# Patient Record
Sex: Female | Born: 1990
Health system: Southern US, Community
[De-identification: ages and names within clinical notes are randomized; demographics above are authoritative.]

## PROBLEM LIST (undated history)

## (undated) DIAGNOSIS — R011 Cardiac murmur, unspecified: Secondary | ICD-10-CM

## (undated) DIAGNOSIS — F419 Anxiety disorder, unspecified: Secondary | ICD-10-CM

## (undated) HISTORY — DX: Cardiac murmur, unspecified: R01.1

## (undated) HISTORY — DX: Anxiety disorder, unspecified: F41.9

## (undated) HISTORY — PX: WISDOM TOOTH EXTRACTION: SHX21

---

## 2015-12-12 LAB — HEPATIC FUNCTION PANEL
ALK PHOS: 36 (ref 25–125)
ALT: 10 (ref 7–35)
AST: 15 (ref 13–35)
Bilirubin, Total: 0.4

## 2015-12-12 LAB — LIPID PANEL
Cholesterol: 153 (ref 0–200)
HDL: 94 — AB (ref 35–70)
LDL CALC: 81
Triglycerides: 61 (ref 40–160)

## 2015-12-12 LAB — CBC AND DIFFERENTIAL
HEMATOCRIT: 42 (ref 36–46)
HEMOGLOBIN: 13.7 (ref 12.0–16.0)
PLATELETS: 246 (ref 150–399)
WBC: 6.4

## 2015-12-12 LAB — BASIC METABOLIC PANEL
BUN: 11 (ref 4–21)
Creatinine: 0.8 (ref 0.5–1.1)
Glucose: 87
SODIUM: 138 (ref 137–147)

## 2015-12-12 LAB — VITAMIN D 25 HYDROXY (VIT D DEFICIENCY, FRACTURES): VIT D 25 HYDROXY: 29.4

## 2015-12-12 LAB — TSH: TSH: 2.62 (ref 0.41–5.90)

## 2016-05-27 ENCOUNTER — Other Ambulatory Visit: Payer: Self-pay | Admitting: Family Medicine

## 2016-05-27 DIAGNOSIS — N644 Mastodynia: Secondary | ICD-10-CM

## 2016-06-03 ENCOUNTER — Ambulatory Visit
Admission: RE | Admit: 2016-06-03 | Discharge: 2016-06-03 | Disposition: A | Discharge status: Home / Self Care | Payer: 59 | Source: Ambulatory Visit | Attending: Family Medicine | Admitting: Family Medicine

## 2016-06-03 DIAGNOSIS — N644 Mastodynia: Secondary | ICD-10-CM

## 2017-03-01 ENCOUNTER — Encounter: Payer: Self-pay | Admitting: Family Medicine

## 2017-03-01 ENCOUNTER — Ambulatory Visit (INDEPENDENT_AMBULATORY_CARE_PROVIDER_SITE_OTHER): Payer: 59 | Admitting: Family Medicine

## 2017-03-01 VITALS — HR 156 | Ht 67.0 in | Wt 156.0 lb

## 2017-03-01 DIAGNOSIS — R0781 Pleurodynia: Secondary | ICD-10-CM

## 2017-03-01 DIAGNOSIS — E559 Vitamin D deficiency, unspecified: Secondary | ICD-10-CM

## 2017-03-01 DIAGNOSIS — R000 Tachycardia, unspecified: Secondary | ICD-10-CM | POA: Diagnosis not present

## 2017-03-01 DIAGNOSIS — R011 Cardiac murmur, unspecified: Secondary | ICD-10-CM

## 2017-03-01 DIAGNOSIS — R5383 Other fatigue: Secondary | ICD-10-CM | POA: Diagnosis not present

## 2017-03-01 DIAGNOSIS — R7989 Other specified abnormal findings of blood chemistry: Secondary | ICD-10-CM

## 2017-03-01 DIAGNOSIS — S2329XA Dislocation of other parts of thorax, initial encounter: Secondary | ICD-10-CM | POA: Insufficient documentation

## 2017-03-01 LAB — FERRITIN: Ferritin: 10.8 ng/mL (ref 10.0–291.0)

## 2017-03-01 LAB — BASIC METABOLIC PANEL
BUN: 11 mg/dL (ref 6–23)
CO2: 28 mEq/L (ref 19–32)
Calcium: 9.9 mg/dL (ref 8.4–10.5)
Chloride: 104 mEq/L (ref 96–112)
Creatinine, Ser: 0.69 mg/dL (ref 0.40–1.20)
GFR: 108.2 mL/min (ref >60.00)
Glucose, Bld: 105 mg/dL — ABNORMAL HIGH (ref 70–99)
Potassium: 4.2 mEq/L (ref 3.5–5.1)
Sodium: 138 mEq/L (ref 135–145)

## 2017-03-01 LAB — VITAMIN B12: Vitamin B-12: 413 pg/mL (ref 211–911)

## 2017-03-01 LAB — MAGNESIUM: Magnesium: 1.9 mg/dL (ref 1.5–2.5)

## 2017-03-01 LAB — VITAMIN D 25 HYDROXY (VIT D DEFICIENCY, FRACTURES): VITD: 25.68 ng/mL — ABNORMAL LOW (ref 30.00–100.00)

## 2017-03-01 NOTE — Progress Notes (Signed)
Jennifer Sloan is a 26 y.o. female [facility]   History of Present Illness:   Jennifer Sloan, cma is acting as a Neurosurgeonscribe for W. R. BerkleyErica Ragena Fiola, DO.  HPI:  Heart Murmur. Dx 08/2014. EKG done but unsure if it was normal. Denies chest pain or SHOB. Occasionally she will feel an extra beats and palpations. Sx are typically triggered when she is nervous.  Reports intermittent muscle spasms in her arms, legs and abdominal area. Stress seems to trigger sx. Sx started in 2016. She does not exercise at this time. Sx do not keep her from doing her daily activities but is an annoyance. She was running from her cat x2 months ago when she pulled a muscle in RT leg.   In college she did take Buspar once per day for anxiety. Only took for approximately a year due to medication causing headaches. Medication helped some. She decided to cut out caffeine which did help.  Health Maintenance Due  Topic Date Due  . HIV Screening  10/28/2004  . TETANUS/TDAP  10/28/2008  . PAP SMEAR  10/29/2010   PMHx, SurgHx, SocialHx, Medications, and Allergies were reviewed in the Visit Navigator and updated as appropriate.   Past Medical History:  Diagnosis Date  . Heart murmur    Past Surgical History:  Procedure Laterality Date  . WISDOM TOOTH EXTRACTION Bilateral    Family History  Problem Relation Age of Onset  . Cancer Maternal Grandmother 30       breast  . Cancer Maternal Grandfather        Lung  . Cancer Paternal Grandmother        Colon Cancer  . Cancer Paternal Grandfather        Colon Cancer    Social History  Substance Use Topics  . Smoking status: Never Smoker  . Smokeless tobacco: Never Used  . Alcohol use Yes     Comment: 3-4 beverages per week   Current Medications and Allergies:   .  Ferrous Sulfate (IRON SUPPLEMENT PO), Take 28 mg by mouth once., Disp: , Rfl:   Allergies  Allergen Reactions  . Amoxicillin Rash   Review of Systems:   Pertinent items are noted in the  HPI. Otherwise, ROS is negative.  Vitals:   Vitals:   03/01/17 1054  Pulse: (!) 156  SpO2: 99%  Weight: 156 lb (70.8 kg)  Height: 5\' 7"  (1.702 m)     Body mass index is 24.43 kg/m.  Physical Exam:   Physical Exam  Constitutional: She appears well-developed and well-nourished. No distress.  HENT:  Head: Normocephalic and atraumatic.  Right Ear: External ear normal.  Left Ear: External ear normal.  Nose: Nose normal.  Mouth/Throat: Oropharynx is clear and moist.  Eyes: Pupils are equal, round, and reactive to light. Conjunctivae and EOM are normal.  Neck: Normal range of motion. Neck supple. No thyromegaly present.  Cardiovascular: Regular rhythm, S1 normal, S2 normal, normal heart sounds and intact distal pulses.  Tachycardia present.   No murmur heard. Pulmonary/Chest: Effort normal and breath sounds normal.  Abdominal: Soft. Bowel sounds are normal.  Musculoskeletal:       Arms: Skin: Skin is warm.  Psychiatric: She has a normal mood and affect. Her behavior is normal.  Nursing note and vitals reviewed.  Assessment and Plan:   Marchelle Folksmanda was seen today for new patient (initial visit).  Diagnoses and all orders for this visit:  Rib pain Comments: Spasm associated with poor posture  and leaning on her left side while sitting at work.  Orders: -     Ambulatory referral to Sports Medicine  Heart murmur Comments: No concerns tody.  Low vitamin D level -     VITAMIN D 25 Hydroxy (Vit-D Deficiency, Fractures)  Tachycardia Comments: Improved to 105 during exam. Patient reports resting HR of 70.  Orders: -     Ferritin -     Vitamin B12 -     Basic metabolic panel -     Magnesium  . Reviewed expectations re: course of current medical issues. . Discussed self-management of symptoms. . Outlined signs and symptoms indicating need for more acute intervention. . Patient verbalized understanding and all questions were answered. Marland Kitchen. Health Maintenance issues including  appropriate healthy diet, exercise, and smoking avoidance were discussed with patient. . See orders for this visit as documented in the electronic medical record. . Patient received an After Visit Summary.  CMA served as Neurosurgeonscribe during this visit. History, Physical, and Plan performed by medical provider. The above documentation has been reviewed and is accurate and complete. Helane RimaErica Loveda Colaizzi, D.O.  Helane RimaErica Rendi Mapel, DO , [place] 03/01/2017  Future Appointments Date Time Provider Department Center  03/10/2017 10:30 AM Andrena Mewsigby, Michael D, DO [facility] None

## 2017-03-09 ENCOUNTER — Telehealth: Payer: Self-pay | Admitting: Family Medicine

## 2017-03-09 NOTE — Telephone Encounter (Signed)
[facility] [place]

## 2017-03-10 ENCOUNTER — Ambulatory Visit (INDEPENDENT_AMBULATORY_CARE_PROVIDER_SITE_OTHER): Payer: 59 | Admitting: Sports Medicine

## 2017-03-10 ENCOUNTER — Ambulatory Visit: Payer: 59 | Admitting: Family Medicine

## 2017-03-10 ENCOUNTER — Encounter: Payer: Self-pay | Admitting: Sports Medicine

## 2017-03-10 VITALS — BP 110/60 | HR 93 | Ht 67.0 in | Wt 116.0 lb

## 2017-03-10 DIAGNOSIS — R0781 Pleurodynia: Secondary | ICD-10-CM

## 2017-03-10 DIAGNOSIS — M9908 Segmental and somatic dysfunction of rib cage: Secondary | ICD-10-CM | POA: Diagnosis not present

## 2017-03-10 NOTE — Patient Instructions (Signed)
Also check out the YouTube Video from Dr. Eric Goodman.  I would like to see you try performing this 5-6 days per week.    A good intro video is: "Independence from Pain 7-minute Video" - https://www.youtube.com/watch?v=[email or web address]   His more advanced video is: "Powerful Posture and Pain Relief: 12 minutes of Foundation Training" - https://youtu.be/[email or web address]   Do not try to attempt this entire video when first beginning.    Try breaking of each exercise that he goes into shorter segments.  Otherwise if they perform an exercise for 45 seconds, start with 15 seconds and rest and then resume with a begin the new activity.  Work your way up to doing this 12 minute video and I expect to see significant improvements in your pain.  

## 2017-03-10 NOTE — Progress Notes (Signed)
OFFICE VISIT NOTE Jennifer Sloan. Jennifer Sloan Sports Medicine [facility] at [facility] [phone]  Jennifer Sloan - 26 y.o. female MRN [id number]  Date of birth: [birth year]  Visit Date: 03/10/2017  PCP: Helane Rima, DO   Referred by: Helane Rima, DO  Autumn McNeil, cma acting as scribe for Dr. Berline Chough.  SUBJECTIVE:   Chief Complaint  Patient presents with  . NP: Rib Pain   HPI: As below and per problem based documentation when appropriate.   Jennifer Sloan reports LUQ pain since January 2016 after exercising. Pain is intermittent. Trigger at rest and with movement. When twisting her upper body there is a pulling sensation. When sitting at her desk slouching the pain is noticeable. Describes pain as dull. At times here is slight radiation upper toward left side. Takes Ibuprofen with severe discomfort.    Review of Systems  Constitutional: Negative for chills, diaphoresis, fever, malaise/fatigue and weight loss.  HENT: Negative.   Eyes: Negative.   Respiratory: Negative.   Cardiovascular: Negative.   Gastrointestinal: Negative.   Genitourinary: Negative.   Musculoskeletal: Positive for joint pain. Negative for back pain, falls, myalgias and neck pain.  Skin: Negative for itching and rash.  Neurological: Negative.  Negative for weakness.  Endo/Heme/Allergies: Negative for environmental allergies and polydipsia. Does not bruise/bleed easily.  Psychiatric/Behavioral: Negative.     Otherwise per HPI.  HISTORY & PERTINENT PRIOR DATA:  No specialty comments available. She reports that she has never smoked. She has never used smokeless tobacco. No results for input(s): HGBA1C, LABURIC in the last 8760 hours. Medications & Allergies reviewed per EMR Patient Active Problem List   Diagnosis Date Noted  . Heart murmur 03/01/2017  . Costochondral separation 03/01/2017   Past Medical History:  Diagnosis Date  . Heart murmur    Family History  Problem Relation Age of  Onset  . Cancer Maternal Grandmother 30       breast  . Cancer Maternal Grandfather        Lung  . Cancer Paternal Grandmother        Colon Cancer  . Cancer Paternal Grandfather        Colon Cancer   Past Surgical History:  Procedure Laterality Date  . WISDOM TOOTH EXTRACTION Bilateral    Social History   Occupational History  . Not on file.   Social History Main Topics  . Smoking status: Never Smoker  . Smokeless tobacco: Never Used  . Alcohol use Yes     Comment: 3-4 beverages per week  . Drug use: No  . Sexual activity: No    OBJECTIVE:  VS:  HT:5\' 7"  (170.2 cm)   WT:116 lb (52.6 kg)  BMI:18.2    BP:110/60  HR:93bpm  TEMP: ( )  RESP:99 % EXAM: Findings:  WDWN, NAD, Non-toxic appearing Alert & appropriately interactive Not depressed or anxious appearing No increased work of breathing. Pupils are equal. EOM intact without nystagmus No clubbing or cyanosis of the extremities appreciated No significant rashes/lesions/ulcerations overlying the examined area. DP & PT pulses 2+/4.  No significant pretibial edema.  No clubbing or cyanosis Sensation intact to light touch in lower extremities.  Back: Overall fairly normal alignment with slight rib prominence on the  right.  She does have a slight protrusion of the left anterior ribs but this is anatomic.  She is slightly tender in this area.  She has poor thoracic mobility to the left greater than right.  No focal bony tenderness. OSTEOPATHIC/STRUCTURAL  EXAM:   T2 through T6 neutral rotated right, side bent left Ribs 7,8 and 9 are posterior on the left and at the costochondral junction on the left     No results found. ASSESSMENT & PLAN:     ICD-10-CM   1. Rib pain R07.81   2. Somatic dysfunction of rib cage region M99.08   ================================================================= Costochondral separation She has some underlying anatomic prominence that was worsened with the incident that occurred last  year.  There is no significant pathologic change however she does have a functional rotational issue of the thoracic spine with associated rib changes.  Osteopathic medication performed today as below and therapeutic exercises working on thoracic rotation and posterior chain conditioning were reviewed.  ++++++++++++++++++++++++++++++++++++++++++++ PROCEDURE NOTE : OSTEOPATHIC MANIPULATION The decision today to treat with Osteopathic Manipulative Therapy (OMT) was based on physical exam findings. Verbal consent was obtained after after explanation of risks, benefits and potential side effects, including acute pain flare, post manipulation soreness and need for repeat treatments.  If Cervical manipulation was performed additional time was spent discussing the associated minimal risk of  injury to neurovascular structures.  After consent was obtained manipulation was performed as below:            Regions treated:  Per billing codes          Techniques used:  Muscle Energy, HVLA  The patient tolerated the treatment well and reported Improved symptoms following treatment today. Patient was given medications, exercises, stretches and lifestyle modifications per AVS and verbally.    ================================================================= Patient Instructions  Also check out the YouTube Video from Dr. Myles LippsEric Goodman.  I would like to see you try performing this 5-6 days per week.    A good intro video is: "Independence from Pain 7-minute Video" - https://[email or web address]://www.[email or web address]/watch?[email or web address]   His more advanced video is: "Powerful Posture and Pain Relief: 12 minutes of Foundation Training" - https://youtu.be/[email or web address]   Do not try to attempt this entire video when first beginning.    Try breaking of each exercise that he goes into shorter segments.  Otherwise if they perform an exercise for 45 seconds, start with 15 seconds and rest and then resume with a begin the new activity.  Work your way up  to doing this 12 minute video and I expect to see significant improvements in your pain. =================================================================   Follow-up: Return in about 6 weeks (around 04/21/2017).   CMA/ATC served as Neurosurgeonscribe during this visit. History, Physical, and Plan performed by medical provider. Documentation and orders reviewed and attested to.      Gaspar BiddingMichael Rigby, DO    [facility]

## 2017-03-15 NOTE — Telephone Encounter (Signed)
[facility] [place] forward 8 pages to Coca ColaWallace Eric DO

## 2017-03-28 NOTE — Assessment & Plan Note (Signed)
She has some underlying anatomic prominence that was worsened with the incident that occurred last year.  There is no significant pathologic change however she does have a functional rotational issue of the thoracic spine with associated rib changes.  Osteopathic medication performed today as below and therapeutic exercises working on thoracic rotation and posterior chain conditioning were reviewed.  ++++++++++++++++++++++++++++++++++++++++++++ PROCEDURE NOTE : OSTEOPATHIC MANIPULATION The decision today to treat with Osteopathic Manipulative Therapy (OMT) was based on physical exam findings. Verbal consent was obtained after after explanation of risks, benefits and potential side effects, including acute pain flare, post manipulation soreness and need for repeat treatments.  If Cervical manipulation was performed additional time was spent discussing the associated minimal risk of  injury to neurovascular structures.  After consent was obtained manipulation was performed as below:            Regions treated:  Per billing codes          Techniques used:  Muscle Energy, HVLA  The patient tolerated the treatment well and reported Improved symptoms following treatment today. Patient was given medications, exercises, stretches and lifestyle modifications per AVS and verbally.

## 2017-03-30 ENCOUNTER — Encounter: Payer: Self-pay | Admitting: Family Medicine

## 2017-09-29 ENCOUNTER — Encounter: Payer: Self-pay | Admitting: Sports Medicine

## 2017-09-29 ENCOUNTER — Ambulatory Visit: Payer: 59 | Admitting: Sports Medicine

## 2017-09-29 VITALS — BP 118/68 | HR 113 | Ht 67.0 in | Wt 119.4 lb

## 2017-09-29 DIAGNOSIS — M9908 Segmental and somatic dysfunction of rib cage: Secondary | ICD-10-CM | POA: Diagnosis not present

## 2017-09-29 DIAGNOSIS — M9901 Segmental and somatic dysfunction of cervical region: Secondary | ICD-10-CM | POA: Diagnosis not present

## 2017-09-29 DIAGNOSIS — M25512 Pain in left shoulder: Secondary | ICD-10-CM | POA: Diagnosis not present

## 2017-09-29 DIAGNOSIS — M9902 Segmental and somatic dysfunction of thoracic region: Secondary | ICD-10-CM

## 2017-09-29 DIAGNOSIS — R0781 Pleurodynia: Secondary | ICD-10-CM

## 2017-09-29 DIAGNOSIS — M9903 Segmental and somatic dysfunction of lumbar region: Secondary | ICD-10-CM

## 2017-09-29 DIAGNOSIS — M9904 Segmental and somatic dysfunction of sacral region: Secondary | ICD-10-CM

## 2017-09-29 NOTE — Progress Notes (Signed)
Jennifer Sloan. Jennifer Sloan Sports Medicine [facility] at [facility] [phone]  Jennifer Sloan - 27 y.o. female MRN [id number]  Date of birth: 1991/04/18  Visit Date: 09/29/2017  PCP: Helane Rima, DO   Referred by: Helane Rima, DO   Scribe for today's visit: Christoper Fabian, LAT, ATC     SUBJECTIVE:  Jennifer Sloan is here for Follow-up (L shoulder and back pain) .   Info from OV on 03/10/17: [name] reports LUQ pain since January 2016 after exercising. Pain is intermittent. Trigger at rest and with movement. When twisting her upper body there is a pulling sensation. When sitting at her desk slouching the pain is noticeable. Describes pain as dull. At times here is slight radiation upper toward left side. Takes Ibuprofen with severe discomfort.   Compared to the last office visit on 03/10/17, her previously described L shoulder, rib and back pain symptoms are worsening w/ increased pain and muscle tension w/ main location of symptoms being in the L neck, upper trap and L sholder. Current symptoms are mild & are radiating to L UE. She has been taking IBU prn.  She states that she has not been doing the [place] exercises on a regular basis.   ROS Reports night time disturbances. Denies fevers, chills, or night sweats. Denies unexplained weight loss. Denies personal history of cancer. Denies changes in bowel or bladder habits. Denies recent unreported falls. Denies new or worsening dyspnea or wheezing. Denies headaches or dizziness.  Denies numbness, tingling or weakness  In the extremities.  Denies dizziness or presyncopal episodes Denies lower extremity edema     HISTORY & PERTINENT PRIOR DATA:  Prior History reviewed and updated per electronic medical record.  Significant history, findings, studies and interim changes include:  reports that  has never smoked. she has never used smokeless tobacco. No results for input(s): HGBA1C, LABURIC, CREATINE in the last  8760 hours. No specialty comments available. No problems updated.  OBJECTIVE:  VS:  HT:5\' 7"  (170.2 cm)   WT:119 lb 6.4 oz (54.2 kg)  BMI:18.7    BP:118/68  HR:(!) 113bpm  TEMP: ( )  RESP:99 %   PHYSICAL EXAM: Constitutional: WDWN, Non-toxic appearing. Psychiatric: Alert & appropriately interactive.  Not depressed or anxious appearing. Respiratory: No increased work of breathing.  Trachea Midline Eyes: Pupils are equal.  EOM intact without nystagmus.  No scleral icterus  NEUROVASCULAR exam: No clubbing or cyanosis appreciated No significant venous stasis changes Capillary Refill: normal, less than 2 seconds   Full overhead range of motion bilateral shoulders.  Generalized length is 4 out of 5 with internal rotation, external rotation, empty can testing and speeds testing bilaterally.  She has protracted shoulders bilaterally left worse than right.  Otherwise structural exam per procedure note   ASSESSMENT & PLAN:   1. Pain in joint of left shoulder   2. Rib pain   3. Somatic dysfunction of cervical region   4. Somatic dysfunction of thoracic region   5. Somatic dysfunction of rib cage region   6. Somatic dysfunction of lumbar region   7. Somatic dysfunction of sacral region    ++++++++++++++++++++++++++++++++++++++++++++ Orders & Meds: No orders of the defined types were placed in this encounter.  No orders of the defined types were placed in this encounter.  ++++++++++++++++++++++++++++++++++++++++++++ PLAN:   Findings:  She has generalized hypermobility with poor stability of bilateral shoulders with a anterior chain dominance.  She will benefit from continued therapeutic exercises and home  exercise program.  Formal referral to physical therapy was discussed and she would like to hold off on this at this time given the symptoms tend to only be intermittent and are functional in nature.  We will plan to check in with her in 4 weeks and see how she is doing at that time  and plan to reevaluate for potential repeat osteopathic manipulation   No problem-specific Assessment & Plan notes found for this encounter.   Follow-up: Return in about 4 weeks (around 10/27/2017).   Pertinent documentation may be included in additional procedure notes, imaging studies, problem based documentation and patient instructions. Please see these sections of the encounter for additional information regarding this visit. CMA/ATC served as Neurosurgeonscribe during this visit. History, Physical, and Plan performed by medical provider. Documentation and orders reviewed and attested to.      Andrena MewsMichael D Rigby, DO    [facility] Physician

## 2017-09-29 NOTE — Procedures (Signed)
PROCEDURE NOTE : OSTEOPATHIC MANIPULATION The decision today to treat with Osteopathic Manipulative Therapy (OMT) was based on physical exam findings. Verbal consent was obtained following a discussion with the patient regarding the of risks, benefits and potential side effects, including an acute pain flare,post manipulation soreness and need for repeat treatments.     Contraindications: NONE  Manipulation was performed as below: Regions Treated: lumbar spine Techniques used: HVLA, muscle energy, myofascial release, soft tissue, counterstrain and Articulatory The patient tolerated the treatment well and reported Improved symptoms following treatment today. Patient was given medications, exercises, stretches and lifestyle modifications per AVS and verbally.     OSTEOPATHIC/STRUCTURAL EXAM FINDINGS:    C2 through C4 FRS left  C5 FRS right  T2 extended side bent right  T4 through T6 rotated left  L3 FRS right  Left on left sacral torsion  Right anterior innominate  Tight right hamstring compared to the left but 170 degree popliteal angle

## 2017-09-29 NOTE — Patient Instructions (Signed)

## 2017-10-27 ENCOUNTER — Ambulatory Visit: Payer: 59 | Admitting: Sports Medicine

## 2017-12-06 ENCOUNTER — Telehealth: Payer: Self-pay | Admitting: Family Medicine

## 2017-12-06 NOTE — Telephone Encounter (Signed)
Copied from CRM [id number]. Topic: General - Other >> Dec 06, 2017  1:59 PM Eston Mould B wrote: Reason for CRM:  PT schedule cpe for 8/2 and will come in fasting for her labs,  she needs lab orders

## 2017-12-07 NOTE — Telephone Encounter (Signed)
Labs will be ordered the day of visit.

## 2017-12-23 DIAGNOSIS — M542 Cervicalgia: Secondary | ICD-10-CM | POA: Diagnosis not present

## 2017-12-23 DIAGNOSIS — M546 Pain in thoracic spine: Secondary | ICD-10-CM | POA: Diagnosis not present

## 2017-12-23 DIAGNOSIS — M545 Low back pain: Secondary | ICD-10-CM | POA: Diagnosis not present

## 2017-12-28 DIAGNOSIS — M545 Low back pain: Secondary | ICD-10-CM | POA: Diagnosis not present

## 2017-12-28 DIAGNOSIS — M542 Cervicalgia: Secondary | ICD-10-CM | POA: Diagnosis not present

## 2017-12-28 DIAGNOSIS — M546 Pain in thoracic spine: Secondary | ICD-10-CM | POA: Diagnosis not present

## 2017-12-30 DIAGNOSIS — M546 Pain in thoracic spine: Secondary | ICD-10-CM | POA: Diagnosis not present

## 2017-12-30 DIAGNOSIS — M542 Cervicalgia: Secondary | ICD-10-CM | POA: Diagnosis not present

## 2017-12-30 DIAGNOSIS — M545 Low back pain: Secondary | ICD-10-CM | POA: Diagnosis not present

## 2018-01-04 DIAGNOSIS — M545 Low back pain: Secondary | ICD-10-CM | POA: Diagnosis not present

## 2018-01-04 DIAGNOSIS — M542 Cervicalgia: Secondary | ICD-10-CM | POA: Diagnosis not present

## 2018-01-04 DIAGNOSIS — M546 Pain in thoracic spine: Secondary | ICD-10-CM | POA: Diagnosis not present

## 2018-03-04 ENCOUNTER — Encounter: Payer: 59 | Admitting: Family Medicine

## 2018-03-18 ENCOUNTER — Encounter: Payer: Self-pay | Admitting: Family Medicine

## 2018-03-18 ENCOUNTER — Ambulatory Visit (INDEPENDENT_AMBULATORY_CARE_PROVIDER_SITE_OTHER): Payer: 59 | Admitting: Family Medicine

## 2018-03-18 VITALS — BP 132/72 | HR 113 | Temp 98.4°F | Ht 67.0 in | Wt 116.2 lb

## 2018-03-18 DIAGNOSIS — E559 Vitamin D deficiency, unspecified: Secondary | ICD-10-CM

## 2018-03-18 DIAGNOSIS — R5383 Other fatigue: Secondary | ICD-10-CM | POA: Diagnosis not present

## 2018-03-18 DIAGNOSIS — Z0000 Encounter for general adult medical examination without abnormal findings: Secondary | ICD-10-CM | POA: Diagnosis not present

## 2018-03-18 DIAGNOSIS — Z13220 Encounter for screening for lipoid disorders: Secondary | ICD-10-CM | POA: Diagnosis not present

## 2018-03-18 DIAGNOSIS — R002 Palpitations: Secondary | ICD-10-CM

## 2018-03-18 LAB — CBC WITH DIFFERENTIAL/PLATELET
Basophils Absolute: 0 10*3/uL (ref 0.0–0.1)
Basophils Relative: 0.7 % (ref 0.0–3.0)
Eosinophils Absolute: 0.2 10*3/uL (ref 0.0–0.7)
Eosinophils Relative: 2.6 % (ref 0.0–5.0)
HCT: 42 % (ref 36.0–46.0)
Hemoglobin: 14.2 g/dL (ref 12.0–15.0)
Lymphocytes Relative: 30.5 % (ref 12.0–46.0)
Lymphs Abs: 2 10*3/uL (ref 0.7–4.0)
MCHC: 33.8 g/dL (ref 30.0–36.0)
MCV: 86.8 fl (ref 78.0–100.0)
Monocytes Absolute: 0.5 10*3/uL (ref 0.1–1.0)
Monocytes Relative: 7.6 % (ref 3.0–12.0)
Neutro Abs: 3.9 10*3/uL (ref 1.4–7.7)
Neutrophils Relative %: 58.6 % (ref 43.0–77.0)
Platelets: 242 10*3/uL (ref 150.0–400.0)
RBC: 4.84 Mil/uL (ref 3.87–5.11)
RDW: 13.3 % (ref 11.5–15.5)
WBC: 6.6 10*3/uL (ref 4.0–10.5)

## 2018-03-18 LAB — COMPREHENSIVE METABOLIC PANEL
ALT: 9 U/L (ref 0–35)
AST: 14 U/L (ref 0–37)
Albumin: 4.6 g/dL (ref 3.5–5.2)
Alkaline Phosphatase: 34 U/L — ABNORMAL LOW (ref 39–117)
BUN: 13 mg/dL (ref 6–23)
CO2: 30 mEq/L (ref 19–32)
Calcium: 9.9 mg/dL (ref 8.4–10.5)
Chloride: 103 mEq/L (ref 96–112)
Creatinine, Ser: 0.8 mg/dL (ref 0.40–1.20)
GFR: 90.53 mL/min (ref >60.00)
Glucose, Bld: 96 mg/dL (ref 70–99)
Potassium: 3.6 mEq/L (ref 3.5–5.1)
Sodium: 139 mEq/L (ref 135–145)
Total Bilirubin: 0.4 mg/dL (ref 0.2–1.2)
Total Protein: 7.6 g/dL (ref 6.0–8.3)

## 2018-03-18 LAB — LIPID PANEL
Cholesterol: 131 mg/dL (ref 0–200)
HDL: 57.3 mg/dL (ref >39.00)
LDL Cholesterol: 63 mg/dL (ref 0–99)
NonHDL: 73.33
Total CHOL/HDL Ratio: 2
Triglycerides: 51 mg/dL (ref 0.0–149.0)
VLDL: 10.2 mg/dL (ref 0.0–40.0)

## 2018-03-18 LAB — T4, FREE: Free T4: 0.96 ng/dL (ref 0.60–1.60)

## 2018-03-18 LAB — MAGNESIUM: Magnesium: 2 mg/dL (ref 1.5–2.5)

## 2018-03-18 LAB — TSH: TSH: 3.68 u[IU]/mL (ref 0.35–4.50)

## 2018-03-18 LAB — VITAMIN D 25 HYDROXY (VIT D DEFICIENCY, FRACTURES): VITD: 34.91 ng/mL (ref 30.00–100.00)

## 2018-03-18 NOTE — Progress Notes (Signed)
Subjective:    Lytle Michaelsmanda Holts is a 27 y.o. female and is here for a comprehensive physical exam.  Current Outpatient Medications:  .  cholecalciferol (VITAMIN D) 1000 units tablet, Take 2,000 Units by mouth daily., Disp: , Rfl:  .  Ferrous Sulfate (IRON SUPPLEMENT PO), Take 28 mg by mouth once., Disp: , Rfl:  .  Magnesium 250 MG TABS, Take 250 mg by mouth., Disp: , Rfl:   Health Maintenance Due  Topic Date Due  . INFLUENZA VACCINE  03/03/2018    PMHx, SurgHx, SocialHx, Medications, and Allergies were reviewed in the Visit Navigator and updated as appropriate.   Past Medical History:  Diagnosis Date  . Heart murmur      Past Surgical History:  Procedure Laterality Date  . WISDOM TOOTH EXTRACTION Bilateral      Family History  Problem Relation Age of Onset  . Breast cancer Maternal Grandmother 30  . Lung cancer Maternal Grandfather   . Colon cancer Paternal Grandmother   . Colon cancer Paternal Grandfather     Social History   Tobacco Use  . Smoking status: Never Smoker  . Smokeless tobacco: Never Used  Substance Use Topics  . Alcohol use: Yes    Comment: 3-4 beverages per week  . Drug use: No    Review of Systems:   Pertinent items are noted in the HPI. Otherwise, ROS is negative.  Objective:   BP 132/72   Pulse (!) 113   Temp 98.4 F (36.9 C) (Oral)   Ht 5\' 7"  (1.702 m)   Wt 116 lb 3.2 oz (52.7 kg)   LMP 03/03/2018   SpO2 99%   BMI 18.20 kg/m    General appearance: alert, cooperative and appears stated age. Head: normocephalic, without obvious abnormality, atraumatic. Neck: no adenopathy, supple, symmetrical, trachea midline; thyroid not enlarged, symmetric, no tenderness/mass/nodules. Lungs: clear to auscultation bilaterally. Heart: regular rate and rhythm Abdomen: soft, non-tender; no masses,  no organomegaly. Extremities: extremities normal, atraumatic, no cyanosis or edema. Skin: skin color, texture, turgor normal, no rashes or  lesions. Lymph: cervical, supraclavicular, and axillary nodes normal; no abnormal inguinal nodes palpated. Neurologic: grossly normal.  EKG: normal sinus rhythm.  Assessment/Plan:   Jennifer Sloan was seen today for annual exam.  Diagnoses and all orders for this visit:  Routine physical examination  Screening for lipid disorders -     Lipid panel  Fatigue, unspecified type -     CBC with Differential/Platelet -     Comprehensive metabolic panel  Vitamin D deficiency -     VITAMIN D 25 Hydroxy (Vit-D Deficiency, Fractures)  Palpitations -     EKG 12-Lead -     ECHOCARDIOGRAM COMPLETE; Future -     TSH -     T4, free -     Magnesium    Patient Counseling:   [x]     Nutrition: Stressed importance of moderation in sodium/caffeine intake, saturated fat and cholesterol, caloric balance, sufficient intake of fresh fruits, vegetables, fiber, calcium, iron, and 1 mg of folate supplement per day (for females capable of pregnancy).   [x]      Stressed the importance of regular exercise.    [x]     Substance Abuse: Discussed cessation/primary prevention of tobacco, alcohol, or other drug use; driving or other dangerous activities under the influence; availability of treatment for abuse.    [x]      Injury prevention: Discussed safety belts, safety helmets, smoke detector, smoking near bedding or upholstery.    [  x]     Sexuality: Discussed sexually transmitted diseases, partner selection, use of condoms, avoidance of unintended pregnancy  and contraceptive alternatives.    [x]     Dental health: Discussed importance of regular tooth brushing, flossing, and dental visits.   [x]      Health maintenance and immunizations reviewed. Please refer to Health maintenance section.   Briscoe Deutscher, DO [place]

## 2018-04-05 ENCOUNTER — Other Ambulatory Visit: Payer: Self-pay

## 2018-04-05 ENCOUNTER — Ambulatory Visit (HOSPITAL_COMMUNITY): Payer: 59 | Attending: Family Medicine

## 2018-04-05 DIAGNOSIS — R002 Palpitations: Secondary | ICD-10-CM | POA: Diagnosis not present

## 2018-05-19 ENCOUNTER — Ambulatory Visit: Payer: 59 | Admitting: Sports Medicine

## 2018-05-19 ENCOUNTER — Encounter: Payer: Self-pay | Admitting: Sports Medicine

## 2018-05-19 ENCOUNTER — Other Ambulatory Visit: Payer: Self-pay

## 2018-05-19 VITALS — BP 120/72 | HR 120 | Ht 67.0 in | Wt 116.0 lb

## 2018-05-19 DIAGNOSIS — M9901 Segmental and somatic dysfunction of cervical region: Secondary | ICD-10-CM | POA: Diagnosis not present

## 2018-05-19 DIAGNOSIS — I059 Rheumatic mitral valve disease, unspecified: Secondary | ICD-10-CM

## 2018-05-19 DIAGNOSIS — M9902 Segmental and somatic dysfunction of thoracic region: Secondary | ICD-10-CM

## 2018-05-19 DIAGNOSIS — M25512 Pain in left shoulder: Secondary | ICD-10-CM | POA: Diagnosis not present

## 2018-05-19 DIAGNOSIS — R002 Palpitations: Secondary | ICD-10-CM

## 2018-05-19 DIAGNOSIS — M9903 Segmental and somatic dysfunction of lumbar region: Secondary | ICD-10-CM

## 2018-05-19 DIAGNOSIS — M9908 Segmental and somatic dysfunction of rib cage: Secondary | ICD-10-CM

## 2018-05-19 NOTE — Progress Notes (Signed)
PROCEDURE NOTE : OSTEOPATHIC MANIPULATION The decision today to treat with Osteopathic Manipulative Therapy (OMT) was based on physical exam findings. Verbal consent was obtained following a discussion with the patient regarding the of risks, benefits and potential side effects, including an acute pain flare,post manipulation soreness and need for repeat treatments.     Contraindications to OMT: NONE  Manipulation was performed as below: Regions Treated OMT Techniques Used  Cervical spine Thoracic spine Ribs Lumbar spine HVLA muscle energy myofascial release     The patient tolerated the treatment well and reported Improved symptoms following treatment today. Patient was given medications, exercises, stretches and lifestyle modifications per AVS and verbally.   OSTEOPATHIC/STRUCTURAL EXAM:   OA - rotated right C2 FRS left (Flexed, Rotated & Sidebent) C7 FRS right (Flexed, Rotated & Sidebent) T2 -8 Neutral, Rotated LEFT, Sidebent RIGHT Rib 6 Right  Posterior L1 FRS right (Flexed, Rotated & Sidebent) Anterior [place] jiont

## 2018-05-19 NOTE — Progress Notes (Signed)
Cardiology Office Note   Date:  05/20/2018   ID:  Jennifer Sloan, DOB 1991-05-14, MRN [id number]  PCP:  Helane Rima, DO  Cardiologist:   No primary care provider on file. Referring:      Chief Complaint  Patient presents with  . Abnormal Echo      History of Present Illness: Jennifer Sloan is a 27 y.o. female who is referred by Helane Rima, DO for evaluation of an abnormal echo.  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  She had a NL LVEF.  She is had no cardiac history.  She is been healthy although stressed with a very active. The patient denies any new symptoms such as chest discomfort, neck or arm discomfort. There has been no new shortness of breath, PND or orthopnea. There have been no reported palpitations, presyncope or syncope.  She is always had a rapid heart rate.  She says she gets nervous and she does have anxiety but she working on.  She is always been thin.  She is not any weight loss.  She does not feel her palpitations or rapid heart rate except for occasional PVCs which she is had for years.  She is been told about these.  She will feel skipped beat occasionally when she becomes anxious.  She does not have any presyncope or syncope.  She has no chest pressure, neck or arm discomfort.  She denies any shortness of breath, PND or orthopnea.   Past Medical History:  Diagnosis Date  . Anxiety   . Heart murmur     Past Surgical History:  Procedure Laterality Date  . WISDOM TOOTH EXTRACTION Bilateral      Current Outpatient Medications  Medication Sig Dispense Refill  . ASHWAGANDHA PO Take 450 mg by mouth.     . cholecalciferol (VITAMIN D) 1000 units tablet Take 2,000 Units by mouth daily.    . Ferrous Sulfate (IRON SUPPLEMENT PO) Take 28 mg by mouth once.    . Magnesium 250 MG TABS Take 250 mg by mouth.    . Omega-3 Fatty Acids (FISH OIL) 500 MG CAPS Take by mouth.     No current facility-administered medications for this  visit.     Allergies:   Amoxicillin    Social History:  The patient  reports that she has never smoked. She has never used smokeless tobacco. She reports that she drinks alcohol. She reports that she does not use drugs.   Family History:  The patient's family history includes Breast cancer (age of onset: 22) in her maternal grandmother; Colon cancer in her paternal grandfather and paternal grandmother; Lung cancer in her maternal grandfather; Mitral valve prolapse in her mother.    ROS:  Please see the history of present illness.   Otherwise, review of systems are positive for none.   All other systems are reviewed and negative.    PHYSICAL EXAM: VS:  BP 138/89   Pulse (!) 128   Ht 5\' 7"  (1.702 m)   Wt 118 lb (53.5 kg)   LMP 05/03/2018 (Approximate)   SpO2 96%   BMI 18.48 kg/m  , BMI Body mass index is 18.48 kg/m. GENERAL:  Well appearing HEENT:  Pupils equal round and reactive, fundi not visualized, oral mucosa unremarkable NECK:  No jugular venous distention, waveform within normal limits, carotid upstroke brisk and symmetric, no bruits, no thyromegaly LYMPHATICS:  No cervical, inguinal adenopathy LUNGS:  Clear to auscultation bilaterally BACK:  No CVA tenderness  CHEST:  Unremarkable HEART:  PMI not displaced or sustained,S1 and S2 within normal limits, no S3, no S4, no clicks, no rubs, no murmurs ABD:  Flat, positive bowel sounds normal in frequency in pitch, no bruits, no rebound, no guarding, no midline pulsatile mass, no hepatomegaly, no splenomegaly EXT:  2 plus pulses throughout, no edema, no cyanosis no clubbing SKIN:  No rashes no nodules NEURO:  Cranial nerves II through XII grossly intact, motor grossly intact throughout PSYCH:  Cognitively intact, oriented to person place and time    EKG:  EKG is not ordered today. The ekg ordered 03/18/18 demonstrates sinus rhythm, rate 90, axis within normal limits, intervals within normal limits, no acute ST-T wave changes,  premature ventricular contractions.   Recent Labs: 03/18/2018: ALT 9; BUN 13; Creatinine, Ser 0.80; Hemoglobin 14.2; Magnesium 2.0; Platelets 242.0; Potassium 3.6; Sodium 139; TSH 3.68    Lipid Panel    Component Value Date/Time   CHOL 131 03/18/2018 0803   TRIG 51.0 03/18/2018 0803   HDL 57.30 03/18/2018 0803   CHOLHDL 2 03/18/2018 0803   VLDL 10.2 03/18/2018 0803   LDLCALC 63 03/18/2018 0803      Wt Readings from Last 3 Encounters:  05/20/18 118 lb (53.5 kg)  05/19/18 116 lb (52.6 kg)  03/18/18 116 lb 3.2 oz (52.7 kg)      Other studies Reviewed: Additional studies/ records that were reviewed today include: Echo, labs Review of the above records demonstrates:  Please see elsewhere in the note.     ASSESSMENT AND PLAN:  Abnormal echo:   The patient has some myxomatous changes on her mitral valve but no regurgitation and no prolapse.  No further testing is indicated.  We reviewed the anatomy.  PVCs: She has these long-standing.  She had normal thyroids normal electrolytes.  They are not particularly problematic.  She drinks 1 cup of coffee in the morning but she would like to continue this.  I do not think she needs any further work-up or change in her therapy as these of been a chronic long-standing and not symptomatic.    TACHYCARDIA: We discussed exercise we discussed stress management.   Current medicines are reviewed at length with the patient today.  The patient does not have concerns regarding medicines.  The following changes have been made:  no change  Labs/ tests ordered today include: None No orders of the defined types were placed in this encounter.    Disposition:   FU with me as needed.     Signed, Rollene Rotunda, MD  05/20/2018 11:00 AM    [facility]  '

## 2018-05-19 NOTE — Progress Notes (Signed)
Jennifer Sloan. Jennifer Sloan [facility] [phone]  Jennifer Sloan - 27 y.o. female MRN [id number]  Date of birth: 04/12/1991  Visit Date: 05/19/2018  PCP: Helane Rima, DO   Referred by: Helane Rima, DO   Scribe(s) for today's visit: Stevenson Clinch, CMA  SUBJECTIVE:  Jennifer Sloan is here for Follow-up (neck, L shoulder pain)   HPI: 03/10/17: [name] reports LUQ pain since January 2016 after exercising. Pain is intermittent. Trigger at rest and with movement. When twisting her upper body there is a pulling sensation. When sitting at her desk slouching the pain is noticeable. Describes pain as dull. At times here is slight radiation upper toward left side. Takes Ibuprofen with severe discomfort.  09/29/2017: Compared to the last office visit on 03/10/17, her previously described L shoulder, rib and back pain symptoms are worsening w/ increased pain and muscle tension w/ main location of symptoms being in the L neck, upper trap and L sholder. Current symptoms are mild & are radiating to L UE. She has been taking IBU prn.  She states that she has not been doing the [place] exercises on a regular basis.  05/19/2018: Compared to the last office visit, her previously described symptoms are worsening. Current symptoms are mild tightness lasting only a few sec & are radiating to the L shoulder/periscapular. She reports popping in her shoulder and neck.  She has been seeing a chiropractor (Dr. Glendale Chard) and feels that she may have been "adjusted too hard". She was last seen in Aug. Since then she has noticed dizziness 2-3 x weekly. She has noticed increased neck pain which is worse at night when lying down. She has tried using ice with temporary relief. Some relief with IBU, takes sparingly (q2-3 weeks). She gets the most relief with stretching. She has responded well to OMT in the past, feels that the L shoulder has loosened up with OMT in the past.  She reports feeling dizzy and "jolted" even when lying in her bed. She was seen by Dr. Earlene Plater and told that she had fluid in her ear, was not told what she could do about it. She feels pressure in the L ear at times.   REVIEW OF SYSTEMS: Reports night time disturbances. Denies fevers, chills, or night sweats. Denies unexplained weight loss. Denies personal history of cancer. Denies changes in bowel or bladder habits. Denies recent unreported falls. Denies new or worsening dyspnea or wheezing. Denies headaches.  Denies numbness, tingling or weakness in the extremities.  Reports dizziness or presyncopal episodes Denies lower extremity edema    HISTORY:  Prior history reviewed and updated per electronic medical record.  Social History   Occupational History  . Occupation: Chemical engineer: [employer]  Tobacco Use  . Smoking status: Never Smoker  . Smokeless tobacco: Never Used  Substance and Sexual Activity  . Alcohol use: Yes    Comment: 3-4 beverages per week  . Drug use: No  . Sexual activity: Never   Social History   Social History Narrative   Never sexually active.     DATA OBTAINED & REVIEWED:  No results for input(s): HGBA1C, LABURIC, CREATINE in the last 8760 hours. . None .   OBJECTIVE:  VS:  HT:5\' 7"  (170.2 cm)   WT:116 lb (52.6 kg)  BMI:18.16    BP:120/72  HR:(!) 120bpm  TEMP: ( )  RESP:100 %   PHYSICAL EXAM: CONSTITUTIONAL: Well-developed, Well-nourished  and In no acute distress PSYCHIATRIC: Alert & appropriately interactive. and Not depressed or anxious appearing. RESPIRATORY: No increased work of breathing and Trachea Midline EYES: Pupils are equal., EOM intact without nystagmus. and No scleral icterus.  VASCULAR EXAM: Warm and well perfused NEURO: unremarkable Normal associated myotomal distribution strength to manual muscle testing Normal sensation to light touch Normal and symmetric associated DTRs  MSK  Exam: SHOULDER Findings: No stiffness, no weakness, no crepitus noted  Neck:   Well aligned, no significant torticollis  Midline Bony TTP: none   Paraspinal Muscle Spasm: Yes, left  CERVICAL ROM: limited extension, left rotation and left lateral flexion  NEURAL TENSION SIGNS Right Left  Brachial Plexus Squeeze: normal, no pain normal, no pain  Arm Squeeze Test: normal, no pain normal, no pain  Spurling's Compression Test: normal, no pain normal, no pain  Lhermitte's Compression test: Negative, no radiating pain   REFLEXES Right Left  DTR - C5 -Biceps  2+ 2+  DTR - C6 - Brachiorad 2+ 2+  DTR - C7 - Triceps 2+ 2+   UMN - Hoffman's Negative/Normal Negative/Normal  UMN - Pectoral Negative/Normal Negative/Normal   MOTOR TESTING: Intact in all UE myotomes    ASSESSMENT   1. Pain in joint of left shoulder   2. Somatic dysfunction of cervical region   3. Somatic dysfunction of thoracic region   4. Somatic dysfunction of lumbar region   5. Somatic dysfunction of rib cage region     PLAN:  Pertinent additional documentation may be included in corresponding procedure notes, imaging studies, problem based documentation and patient instructions.  Procedures:  . Osteopathic manipulation was performed today based on physical exam findings.  Please see procedure note for further information including Osteopathic Exam findings  Medications:  No orders of the defined types were placed in this encounter.  Discussion/Instructions: No problem-specific Assessment & Plan notes found for this encounter.  . Markedly tight anterior chain with anterior head carriage.  Discussed the underlying features of tight hip flexors leading to crouched, fetal like position that results in spinal column compression.  Including lumbar hyperflexion with hypermobility, thoracic flexion with restrictive rotation and cervical lordosis reversal  . We discussed referral to physical therapy for dry needling  for the periscapular and subscap/infraspinatus regions. . Work on paying attention to sitting posture . Links to [employer] provided today per Patient Instructions.  These exercises were developed by Myles Lipps, DC with a strong emphasis on core neuromuscular reducation and postural realignment through body-weight exercises. . Continue previously prescribed home exercise program.  . Discussed red flag symptoms that warrant earlier emergent evaluation and patient voices understanding. . Activity modifications and the importance of avoiding exacerbating activities (limiting pain to no more than a 4 / 10 during or following activity) recommended and discussed.  Follow-up:  . Return in about 4 weeks (around 06/16/2018) for consideration of repeat Osteopathic Manipulation.   . If any lack of improvement consider: further diagnostic evaluation with plain film X-ray of shoulder and neck  . At follow up will plan to consider: repeat osteopathic manipulation     CMA/ATC served as scribe during this visit. History, Physical, and Plan performed by medical provider. Documentation and orders reviewed and attested to.      Andrena Mews, DO    [place] [facility] Physician

## 2018-05-20 ENCOUNTER — Ambulatory Visit: Payer: 59 | Admitting: Cardiology

## 2018-05-20 ENCOUNTER — Encounter: Payer: Self-pay | Admitting: Cardiology

## 2018-05-20 VITALS — BP 138/89 | HR 128 | Ht 67.0 in | Wt 118.0 lb

## 2018-05-20 DIAGNOSIS — R931 Abnormal findings on diagnostic imaging of heart and coronary circulation: Secondary | ICD-10-CM | POA: Diagnosis not present

## 2018-05-20 DIAGNOSIS — R000 Tachycardia, unspecified: Secondary | ICD-10-CM | POA: Diagnosis not present

## 2018-05-20 DIAGNOSIS — I493 Ventricular premature depolarization: Secondary | ICD-10-CM | POA: Diagnosis not present

## 2018-05-20 NOTE — Patient Instructions (Signed)

## 2018-05-31 ENCOUNTER — Ambulatory Visit (INDEPENDENT_AMBULATORY_CARE_PROVIDER_SITE_OTHER): Payer: 59 | Admitting: Physical Therapy

## 2018-05-31 ENCOUNTER — Encounter: Payer: Self-pay | Admitting: Physical Therapy

## 2018-05-31 DIAGNOSIS — M6281 Muscle weakness (generalized): Secondary | ICD-10-CM | POA: Diagnosis not present

## 2018-05-31 DIAGNOSIS — G8929 Other chronic pain: Secondary | ICD-10-CM | POA: Diagnosis not present

## 2018-05-31 DIAGNOSIS — M25512 Pain in left shoulder: Secondary | ICD-10-CM

## 2018-05-31 NOTE — Therapy (Signed)
[facility] [address] [place], [place], [address] Phone: [phone]   Fax:  [phone]  Physical Therapy Evaluation  Patient Details  Name: Jennifer Sloan MRN: [id number] Date of Birth: 26-Oct-1990 Referring Provider (PT): Gaspar Bidding   Encounter Date: 05/31/2018  PT End of Session - 05/31/18 1138    Visit Number  1    Number of Visits  12    Date for PT Re-Evaluation  07/12/18    Authorization Type  [employer]    PT Start Time  1015    PT Stop Time  1101    PT Time Calculation (min)  46 min    Activity Tolerance  Patient tolerated treatment well    Behavior During Therapy  [facility] for tasks assessed/performed       Past Medical History:  Diagnosis Date  . Anxiety   . Heart murmur     Past Surgical History:  Procedure Laterality Date  . WISDOM TOOTH EXTRACTION Bilateral     There were no vitals filed for this visit.   Subjective Assessment - 05/31/18 1014    Subjective  Pt states increased pain in L shoulder and L side of neck, pain started around June, but has had pain since 2016.  She recently saw cardiology, but was cleared, no further testing needed. She plays violin, has not been playing recently, due to pain. 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  She is R handed.  She does not exercise or strength train at this time, other than walking.     Limitations  Lifting;Standing;Writing;House hold activities    Patient Stated Goals  Decrease pain in shoulder/neck.     Currently in Pain?  Yes    Pain Score  7     Pain Location  Neck   Neck/L Shoulder   Pain Orientation  Left    Pain Descriptors / Indicators  Tightness    Pain Type  Chronic pain    Pain Onset  More than a month ago    Pain Frequency  Intermittent    Aggravating Factors   Increased UE activity, work duties.          [facility] PT Assessment - 05/31/18 0001      Assessment   Medical Diagnosis  L shoulder pain/ neck pain    Referring Provider (PT)   Gaspar Bidding    Hand Dominance  Right    Prior Therapy  no      Precautions   Precautions  None      Balance Screen   Has the patient fallen in the past 6 months  No      Prior Function   Level of Independence  Independent      Cognition   Overall Cognitive Status  Within Functional Limits for tasks assessed      Posture/Postural Control   Posture Comments  forward head, rounded shoulders, winged L scapula, more prominent ribs on L vs R posteriorly;       ROM / Strength   AROM / PROM / Strength  AROM;Strength      AROM   Overall AROM Comments  Cervical: Mild limitations for L/R rotation due to tightness/soreness;   Shoulder: WFL/ Hypermobile bil,  L>R.       Strength   Overall Strength Comments  Scapular: 4-/5;  Shoulder: 4-/5      Palpation   Palpation comment  Tenderness, triggerpoints in bil UTs,  Increased tenderness in L UT  region vs R;   Right rotation of mid cervical vertebrae; Audible and palpable clunk of L shoulder with Passive IR and end range, and ER.       Special Tests   Other special tests  + scapular weakness and winging on L;   Neg apprehension test; Neg shoulder scour;                 Objective measurements completed on examination: See above findings.      [facility] Adult PT Treatment/Exercise - 05/31/18 0001      Exercises   Exercises  Neck;Shoulder      Neck Exercises: Seated   Neck Retraction  10 reps    Cervical Rotation  10 reps    Shoulder Shrugs  10 reps    Other Seated Exercise  scap squeeze x15;       Neck Exercises: Stretches   Upper Trapezius Stretch  3 reps;30 seconds             PT Education - 05/31/18 1138    Education Details  Initial HEP, PT POC     Person(s) Educated  Patient    Methods  Explanation;Handout;Demonstration;Tactile cues;Verbal cues    Comprehension  Verbalized understanding;Need further instruction       PT Short Term Goals - 05/31/18 1143      PT SHORT TERM GOAL #1   Title  Pt to be  independent with initial HEP for posture    Time  2    Period  Weeks    Status  New    Target Date  06/14/18      PT SHORT TERM GOAL #2   Title  Pt to report decreased pain in L shoulder, to 5/10 with activity     Time  2    Period  Weeks    Status  New    Target Date  06/14/18        PT Long Term Goals - 05/31/18 1144      PT LONG TERM GOAL #1   Title  Pt to report decreased pain of L shoulder and neck, to 0-2/10 with activity     Time  6    Period  Weeks    Status  New    Target Date  07/12/18      PT LONG TERM GOAL #2   Title  Pt to demo increased strenth of shoulder and scapular muscles to at least 4+/5 to improve stability and pain.     Time  6    Period  Weeks    Status  New    Target Date  07/12/18      PT LONG TERM GOAL #3   Title  Pt to independently correct posture, and state correct mechanics for playing violin and desk work.     Time  6    Period  Weeks    Status  New    Target Date  07/12/18             Plan - 05/31/18 1139    Clinical Impression Statement  Pt presents with primary complaint of increased pain in L shoulder and neck. SHe has hypermobile L shoulder, with noted instability and clunking with IR/ER. She has winged scapula on L, with scapular asymmetry seen with UE ROM. She has weakness in postural muscles L/R. Pt with increased tightness , trigger points and tenderness in L UT. She has mild cervical ROM loss due to pain and tightness.  She has poor seated posture and will benefit from education on this. Pt also with weakness in shoulders and UE, with lack of effective HEP. Pt to benefit from skilled PT to improve deficits, pain, and improve functional mobility.     Clinical Presentation  Stable    Clinical Decision Making  Low    Rehab Potential  Good    PT Frequency  2x / week    PT Duration  6 weeks    PT Treatment/Interventions  ADLs/Self Care Home Management;Cryotherapy;Electrical Stimulation;Iontophoresis 4mg /ml Dexamethasone;Moist  Heat;Therapeutic activities;Functional mobility training;Ultrasound;Therapeutic exercise;Neuromuscular re-education;Patient/family education;Dry needling;Passive range of motion;Manual techniques;Taping;Spinal Manipulations;Joint Manipulations    PT Next Visit Plan  Progress strength/UE    Consulted and Agree with Plan of Care  Patient       Patient will benefit from skilled therapeutic intervention in order to improve the following deficits and impairments:  Decreased endurance, Decreased activity tolerance, Decreased strength, Pain, Increased muscle spasms, Decreased range of motion, Hypermobility, Impaired flexibility, Postural dysfunction, Improper body mechanics, Decreased mobility  Visit Diagnosis: Chronic left shoulder pain  Muscle weakness (generalized)     Problem List Patient Active Problem List   Diagnosis Date Noted  . Abnormal echocardiogram 05/20/2018  . PVC's (premature ventricular contractions) 05/20/2018  . Tachycardia 05/20/2018  . Heart murmur 03/01/2017  . Costochondral separation 03/01/2017    Sedalia Muta, PT, DPT 12:13 PM  05/31/18    [facility] [address] [place], [place], [address] Phone: [phone]   Fax:  [phone]  Name: Jennifer Sloan MRN: [id number] Date of Birth: October 25, 1990

## 2018-06-02 ENCOUNTER — Encounter: Payer: Self-pay | Admitting: Physical Therapy

## 2018-06-02 ENCOUNTER — Ambulatory Visit (INDEPENDENT_AMBULATORY_CARE_PROVIDER_SITE_OTHER): Payer: 59 | Admitting: Physical Therapy

## 2018-06-02 DIAGNOSIS — M6281 Muscle weakness (generalized): Secondary | ICD-10-CM

## 2018-06-02 DIAGNOSIS — G8929 Other chronic pain: Secondary | ICD-10-CM | POA: Diagnosis not present

## 2018-06-02 DIAGNOSIS — M25512 Pain in left shoulder: Secondary | ICD-10-CM | POA: Diagnosis not present

## 2018-06-05 ENCOUNTER — Encounter: Payer: Self-pay | Admitting: Physical Therapy

## 2018-06-05 NOTE — Therapy (Signed)
[facility] [address] [place], [place], [address] Phone: [phone]   Fax:  [phone]  Physical Therapy Treatment  Patient Details  Name: Jennifer Sloan MRN: [id number] Date of Birth: 06/06/1991 Referring Provider (PT): Gaspar Bidding   Encounter Date: 06/02/2018  PT End of Session - 06/05/18 1553    Visit Number  2    Number of Visits  12    Date for PT Re-Evaluation  07/12/18    Authorization Type  U[employer]    PT Start Time  1600    PT Stop Time  1643    PT Time Calculation (min)  43 min    Activity Tolerance  Patient tolerated treatment well    Behavior During Therapy  [facility] for tasks assessed/performed       Past Medical History:  Diagnosis Date  . Anxiety   . Heart murmur     Past Surgical History:  Procedure Laterality Date  . WISDOM TOOTH EXTRACTION Bilateral     There were no vitals filed for this visit.  Subjective Assessment - 06/05/18 1551    Subjective  Pt states she has been doing HEP.     Currently in Pain?  Yes    Pain Score  5     Pain Location  Neck    Pain Orientation  Left    Pain Descriptors / Indicators  Tightness    Pain Type  Chronic pain    Pain Onset  More than a month ago    Pain Frequency  Intermittent                       [facility] Adult PT Treatment/Exercise - 06/05/18 0001      Posture/Postural Control   Posture Comments  forward head, rounded shoulders, winged L scapula, more prominent ribs on L vs R posteriorly;       Exercises   Exercises  Neck;Shoulder      Neck Exercises: Theraband   Rows  20 reps;Red    Shoulder External Rotation  20 reps;Red    Shoulder Internal Rotation  20 reps;Red      Neck Exercises: Seated   Neck Retraction  10 reps    Cervical Rotation  10 reps    Shoulder Shrugs  10 reps      Shoulder Exercises: Supine   Protraction  20 reps    Protraction Limitations  SA punch 2lb       Manual Therapy   Manual Therapy  Soft tissue  mobilization;Joint mobilization;Passive ROM;Taping    Joint Mobilization  cervical PA mobs gr 3;     Soft tissue mobilization  DTM bil UT, vcervical paraspinals, and sub occipitals,     Passive ROM  UT stretches    Kinesiotex  Ligament Correction   stability of L shoulder, 2  I strips, a/p and lateral     Neck Exercises: Stretches   Upper Trapezius Stretch  3 reps;30 seconds               PT Short Term Goals - 05/31/18 1143      PT SHORT TERM GOAL #1   Title  Pt to be independent with initial HEP for posture    Time  2    Period  Weeks    Status  New    Target Date  06/14/18      PT SHORT TERM GOAL #2   Title  Pt to report decreased pain in  L shoulder, to 5/10 with activity     Time  2    Period  Weeks    Status  New    Target Date  06/14/18        PT Long Term Goals - 05/31/18 1144      PT LONG TERM GOAL #1   Title  Pt to report decreased pain of L shoulder and neck, to 0-2/10 with activity     Time  6    Period  Weeks    Status  New    Target Date  07/12/18      PT LONG TERM GOAL #2   Title  Pt to demo increased strenth of shoulder and scapular muscles to at least 4+/5 to improve stability and pain.     Time  6    Period  Weeks    Status  New    Target Date  07/12/18      PT LONG TERM GOAL #3   Title  Pt to independently correct posture, and state correct mechanics for playing violin and desk work.     Time  6    Period  Weeks    Status  New    Target Date  07/12/18            Plan - 06/05/18 1554    Clinical Impression Statement  Pt with significant instability in shoulder, started on light strenthening today. Pain and trigger points noted in UTs. K-tape used to increase stability of shouder.     Rehab Potential  Good    PT Frequency  2x / week    PT Duration  6 weeks    PT Treatment/Interventions  ADLs/Self Care Home Management;Cryotherapy;Electrical Stimulation;Iontophoresis 4mg /ml Dexamethasone;Moist Heat;Therapeutic  activities;Functional mobility training;Ultrasound;Therapeutic exercise;Neuromuscular re-education;Patient/family education;Dry needling;Passive range of motion;Manual techniques;Taping;Spinal Manipulations;Joint Manipulations    PT Next Visit Plan  Progress strength/UE    Consulted and Agree with Plan of Care  Patient       Patient will benefit from skilled therapeutic intervention in order to improve the following deficits and impairments:  Decreased endurance, Decreased activity tolerance, Decreased strength, Pain, Increased muscle spasms, Decreased range of motion, Hypermobility, Impaired flexibility, Postural dysfunction, Improper body mechanics, Decreased mobility  Visit Diagnosis: Chronic left shoulder pain  Muscle weakness (generalized)     Problem List Patient Active Problem List   Diagnosis Date Noted  . Abnormal echocardiogram 05/20/2018  . PVC's (premature ventricular contractions) 05/20/2018  . Tachycardia 05/20/2018  . Heart murmur 03/01/2017  . Costochondral separation 03/01/2017    Sedalia Muta, PT, DPT 3:58 PM  06/05/18    [facility] [address] [place], [place], [address] Phone: [phone]   Fax:  [phone]  Name: Jennifer Sloan MRN: [id number] Date of Birth: 04/19/1991

## 2018-06-07 ENCOUNTER — Encounter: Payer: Self-pay | Admitting: Physical Therapy

## 2018-06-07 ENCOUNTER — Ambulatory Visit (INDEPENDENT_AMBULATORY_CARE_PROVIDER_SITE_OTHER): Payer: 59 | Admitting: Physical Therapy

## 2018-06-07 ENCOUNTER — Encounter: Payer: 59 | Admitting: Physical Therapy

## 2018-06-07 DIAGNOSIS — M25512 Pain in left shoulder: Secondary | ICD-10-CM | POA: Diagnosis not present

## 2018-06-07 DIAGNOSIS — G8929 Other chronic pain: Secondary | ICD-10-CM | POA: Diagnosis not present

## 2018-06-07 DIAGNOSIS — M6281 Muscle weakness (generalized): Secondary | ICD-10-CM

## 2018-06-07 NOTE — Patient Instructions (Signed)
Access Code: [email or web address]  URL: [email or web address]  Date: 06/07/2018  Prepared by: Tavarius Grewe   Exercises  Seated Cervical Retraction - 10 reps - 2 sets - 3x daily  Neck Rotation - 10 reps - 1 sets - 3x daily  Standing Shoulder Shrugs - 10 reps - 1 sets - 3x daily  Standing Scapular Retraction - 10 reps - 1 sets - 3x daily  Seated Cervical Sidebending Stretch - 3 reps - 30 hold - 3x daily  Standing Shoulder Scaption - 10 reps - 2 sets - 1x daily  Standing Row with Resistance - 10 reps - 2 sets - 1x daily  Standing Shoulder External Rotation with Resistance - 10 reps - 2 sets - 1x daily  Standing Shoulder Horizontal Abduction with Resistance - 10 reps - 2 sets - 1x daily  Sidelying Shoulder Abduction Palm Forward - 10 reps - 2 sets - 1x daily  Prone Scapular Retraction Arms at Side - 10 reps - 2 sets - 1x daily   

## 2018-06-07 NOTE — Therapy (Signed)
[facility] [address] [place], [place], [address] Phone: [phone]   Fax:  [phone]  Physical Therapy Treatment  Patient Details  Name: Jennifer Sloan MRN: [id number] Date of Birth: [birth year] Referring Provider (PT): Gaspar Bidding   Encounter Date: 06/07/2018  PT End of Session - 06/07/18 1119    Visit Number  3    Number of Visits  12    Date for PT Re-Evaluation  07/12/18    Authorization Type  UHC    PT Start Time  1014    PT Stop Time  1105    PT Time Calculation (min)  51 min    Activity Tolerance  Patient tolerated treatment well    Behavior During Therapy  [facility] for tasks assessed/performed       Past Medical History:  Diagnosis Date  . Anxiety   . Heart murmur     Past Surgical History:  Procedure Laterality Date  . WISDOM TOOTH EXTRACTION Bilateral     There were no vitals filed for this visit.  Subjective Assessment - 06/07/18 1118    Subjective  Pt states some increased soreness in R shoulder blade, UT region since last visit. Continued pain and clicking in L shoulder. Had to remove k-tape after  2 days .     Patient Stated Goals  Decrease pain in shoulder/neck.     Currently in Pain?  Yes    Pain Score  4     Pain Location  Shoulder    Pain Orientation  Left    Pain Descriptors / Indicators  Tightness;Aching    Pain Type  Chronic pain    Pain Onset  More than a month ago    Pain Frequency  Intermittent                       [facility] Adult PT Treatment/Exercise - 06/07/18 1011      Posture/Postural Control   Posture Comments  forward head, rounded shoulders, winged L scapula, more prominent ribs on L vs R posteriorly;       Exercises   Exercises  Neck;Shoulder      Neck Exercises: Theraband   Rows  --    Shoulder External Rotation  --    Shoulder Internal Rotation  --      Neck Exercises: Seated   Neck Retraction  --    Cervical Rotation  --    Shoulder Shrugs  --      Shoulder Exercises: Supine   Protraction  20 reps    Protraction Limitations  SA punch 2lb       Shoulder Exercises: Prone   Horizontal ABduction 1  20 reps;Left    Other Prone Exercises  Quadruped Serratus punch x20;       Shoulder Exercises: Sidelying   ABduction  20 reps;Left    Other Sidelying Exercises  Horizontal Abd 2x10;       Shoulder Exercises: Standing   Horizontal ABduction  20 reps;Theraband    Theraband Level (Shoulder Horizontal ABduction)  Level 1 (Yellow)    Horizontal ABduction Limitations  Scap pull outs     External Rotation  20 reps;Theraband    Theraband Level (Shoulder External Rotation)  Level 2 (Red)    Internal Rotation  20 reps;Theraband    Theraband Level (Shoulder Internal Rotation)  Level 2 (Red)    Row  20 reps;Theraband    Theraband Level (Shoulder Row)  Level 2 (Red)  Manual Therapy   Manual Therapy  Soft tissue mobilization;Joint mobilization;Passive ROM;Taping    Manual therapy comments  skilled palpation and monitoring of soft tissue during dry needling;     Joint Mobilization  cervical PA mobs gr 3;     Soft tissue mobilization  DTM to R UT, rhomboid and levator     Passive ROM  UT stretches    Kinesiotex  --      Neck Exercises: Stretches   Upper Trapezius Stretch  --       Trigger Point Dry Needling - 06/07/18 1127    Consent Given?  Yes    Education Handout Provided  Yes    Muscles Treated Upper Body  Upper trapezius;Rhomboids    Upper Trapezius Response  Palpable increased muscle length;Twitch reponse elicited   R and L done   Rhomboids Response  Palpable increased muscle length   R done          PT Education - 06/07/18 1119    Education Details  HEP reviewed     Person(s) Educated  Patient    Methods  Explanation;Handout;Demonstration;Verbal cues    Comprehension  Verbalized understanding;Need further instruction       PT Short Term Goals - 05/31/18 1143      PT SHORT TERM GOAL #1   Title  Pt to be independent  with initial HEP for posture    Time  2    Period  Weeks    Status  New    Target Date  06/14/18      PT SHORT TERM GOAL #2   Title  Pt to report decreased pain in L shoulder, to 5/10 with activity     Time  2    Period  Weeks    Status  New    Target Date  06/14/18        PT Long Term Goals - 05/31/18 1144      PT LONG TERM GOAL #1   Title  Pt to report decreased pain of L shoulder and neck, to 0-2/10 with activity     Time  6    Period  Weeks    Status  New    Target Date  07/12/18      PT LONG TERM GOAL #2   Title  Pt to demo increased strenth of shoulder and scapular muscles to at least 4+/5 to improve stability and pain.     Time  6    Period  Weeks    Status  New    Target Date  07/12/18      PT LONG TERM GOAL #3   Title  Pt to independently correct posture, and state correct mechanics for playing violin and desk work.     Time  6    Period  Weeks    Status  New    Target Date  07/12/18            Plan - 06/07/18 1120    Clinical Impression Statement  Ther ex progressed for shoulder strength and stability. Manual and dry needling done for soreness and trigger points in UT, levator, and cervical region. Increased trigger points in L vs R UT and rhomboid region today. HEP updated. Patient Consent: After explanation of Trigger Point Dry Needling (TDN)  rationale, procedures, outcomes, and potential side effects, patient verbalized consent to TDN treatment. Post treatment instructions: Patient instructed to expect mild to moderate muscle soreness this evening and tomorrow. Pt instructed  to continue prescribed home exercise program. Pt verbalized understanding of this.     Rehab Potential  Good    PT Frequency  2x / week    PT Duration  6 weeks    PT Treatment/Interventions  ADLs/Self Care Home Management;Cryotherapy;Electrical Stimulation;Iontophoresis 4mg /ml Dexamethasone;Moist Heat;Therapeutic activities;Functional mobility training;Ultrasound;Therapeutic  exercise;Neuromuscular re-education;Patient/family education;Dry needling;Passive range of motion;Manual techniques;Taping;Spinal Manipulations;Joint Manipulations    PT Next Visit Plan  Progress strength/UE    Consulted and Agree with Plan of Care  Patient       Patient will benefit from skilled therapeutic intervention in order to improve the following deficits and impairments:  Decreased endurance, Decreased activity tolerance, Decreased strength, Pain, Increased muscle spasms, Decreased range of motion, Hypermobility, Impaired flexibility, Postural dysfunction, Improper body mechanics, Decreased mobility  Visit Diagnosis: Chronic left shoulder pain  Muscle weakness (generalized)     Problem List Patient Active Problem List   Diagnosis Date Noted  . Abnormal echocardiogram 05/20/2018  . PVC's (premature ventricular contractions) 05/20/2018  . Tachycardia 05/20/2018  . Heart murmur 03/01/2017  . Costochondral separation 03/01/2017    Sedalia Muta, PT, DPT 11:29 AM  06/07/18    [facility] [address] [place], [place], [address] Phone: [phone]   Fax:  [phone]  Name: Laurissa Cowper MRN: [id number] Date of Birth: [birth year]

## 2018-06-10 ENCOUNTER — Ambulatory Visit (INDEPENDENT_AMBULATORY_CARE_PROVIDER_SITE_OTHER): Payer: 59 | Admitting: Physical Therapy

## 2018-06-10 DIAGNOSIS — G8929 Other chronic pain: Secondary | ICD-10-CM

## 2018-06-10 DIAGNOSIS — M25512 Pain in left shoulder: Secondary | ICD-10-CM

## 2018-06-10 DIAGNOSIS — M6281 Muscle weakness (generalized): Secondary | ICD-10-CM | POA: Diagnosis not present

## 2018-06-12 ENCOUNTER — Encounter: Payer: Self-pay | Admitting: Physical Therapy

## 2018-06-12 NOTE — Patient Instructions (Signed)
Access Code: [email or web address]  URL: [email or web address]  Date: 06/07/2018  Prepared by: Sedalia Muta   Exercises  Seated Cervical Retraction - 10 reps - 2 sets - 3x daily  Neck Rotation - 10 reps - 1 sets - 3x daily  Standing Shoulder Shrugs - 10 reps - 1 sets - 3x daily  Standing Scapular Retraction - 10 reps - 1 sets - 3x daily  Seated Cervical Sidebending Stretch - 3 reps - 30 hold - 3x daily  Standing Shoulder Scaption - 10 reps - 2 sets - 1x daily  Standing Row with Resistance - 10 reps - 2 sets - 1x daily  Standing Shoulder External Rotation with Resistance - 10 reps - 2 sets - 1x daily  Standing Shoulder Horizontal Abduction with Resistance - 10 reps - 2 sets - 1x daily  Sidelying Shoulder Abduction Palm Forward - 10 reps - 2 sets - 1x daily  Prone Scapular Retraction Arms at Side - 10 reps - 2 sets - 1x daily

## 2018-06-12 NOTE — Therapy (Signed)
[facility] [address] [place], [place], [address] Phone: [phone]   Fax:  [phone]  Physical Therapy Treatment  Patient Details  Name: Jennifer Sloan MRN: [id number] Date of Birth: 09/06/1990 Referring Provider (PT): Gaspar Bidding   Encounter Date: 06/10/2018  PT End of Session - 06/12/18 1020    Visit Number  4    Number of Visits  12    Date for PT Re-Evaluation  07/12/18    Authorization Type  UHC    PT Start Time  1600    PT Stop Time  1639    PT Time Calculation (min)  39 min    Activity Tolerance  Patient tolerated treatment well    Behavior During Therapy  [facility] for tasks assessed/performed       Past Medical History:  Diagnosis Date  . Anxiety   . Heart murmur     Past Surgical History:  Procedure Laterality Date  . WISDOM TOOTH EXTRACTION Bilateral     There were no vitals filed for this visit.  Subjective Assessment - 06/12/18 1018    Subjective  Pt with mild skin irritation from k-tape, removed after 2 days, no skin irritation present today. Pt states soreness after last visit with DN, but decreased tension in neck in last 2 days.     Currently in Pain?  Yes    Pain Score  4     Pain Location  Shoulder    Pain Orientation  Left    Pain Descriptors / Indicators  Tightness;Aching    Pain Type  Chronic pain    Pain Onset  More than a month ago    Pain Frequency  Intermittent                       [facility] Adult PT Treatment/Exercise - 06/12/18 0001      Posture/Postural Control   Posture Comments  forward head, rounded shoulders, winged L scapula, more prominent ribs on L vs R posteriorly;       Exercises   Exercises  Neck;Shoulder      Shoulder Exercises: Prone   Horizontal ABduction 1  20 reps;Left    Other Prone Exercises  Quadruped Serratus punch x10; UE lifts x10;       Shoulder Exercises: Sidelying   ABduction  20 reps;Left    Other Sidelying Exercises  Horizontal Abd 2x10;        Shoulder Exercises: Standing   Horizontal ABduction  20 reps;Theraband    Theraband Level (Shoulder Horizontal ABduction)  Level 1 (Yellow)    Horizontal ABduction Limitations  Scap pull outs     External Rotation  20 reps;Theraband    Theraband Level (Shoulder External Rotation)  Level 2 (Red)    Internal Rotation  20 reps;Theraband    Theraband Level (Shoulder Internal Rotation)  Level 2 (Red)    Row  20 reps;Theraband    Theraband Level (Shoulder Row)  Level 2 (Red)      Shoulder Exercises: Body Blade   Flexion  30 seconds;2 reps    External Rotation  30 seconds;2 reps    Other Body Blade Exercises  at side/neutral 30 sec x2;       Manual Therapy   Manual Therapy  Soft tissue mobilization;Joint mobilization;Passive ROM;Taping    Joint Mobilization  cervical PA mobs gr 3;     Soft tissue mobilization  DTM to B UT,     Passive ROM  UT  stretches               PT Short Term Goals - 05/31/18 1143      PT SHORT TERM GOAL #1   Title  Pt to be independent with initial HEP for posture    Time  2    Period  Weeks    Status  New    Target Date  06/14/18      PT SHORT TERM GOAL #2   Title  Pt to report decreased pain in L shoulder, to 5/10 with activity     Time  2    Period  Weeks    Status  New    Target Date  06/14/18        PT Long Term Goals - 05/31/18 1144      PT LONG TERM GOAL #1   Title  Pt to report decreased pain of L shoulder and neck, to 0-2/10 with activity     Time  6    Period  Weeks    Status  New    Target Date  07/12/18      PT LONG TERM GOAL #2   Title  Pt to demo increased strenth of shoulder and scapular muscles to at least 4+/5 to improve stability and pain.     Time  6    Period  Weeks    Status  New    Target Date  07/12/18      PT LONG TERM GOAL #3   Title  Pt to independently correct posture, and state correct mechanics for playing violin and desk work.     Time  6    Period  Weeks    Status  New    Target Date  07/12/18             Plan - 06/12/18 1022    Clinical Impression Statement  HEP updated for progressive strengthening. K-tape discontinued due to skin irritation. Pt with fatigue of L shoulder and scapular muscles with ther ex, due to weakneess. Pt will benefit from continued strengthening. Decreased trigger points in L UT since last session, but may require additional dry needling for L and/or R.      Rehab Potential  Good    PT Frequency  2x / week    PT Duration  6 weeks    PT Treatment/Interventions  ADLs/Self Care Home Management;Cryotherapy;Electrical Stimulation;Iontophoresis 4mg /ml Dexamethasone;Moist Heat;Therapeutic activities;Functional mobility training;Ultrasound;Therapeutic exercise;Neuromuscular re-education;Patient/family education;Dry needling;Passive range of motion;Manual techniques;Taping;Spinal Manipulations;Joint Manipulations    PT Next Visit Plan  Progress strength/UE    Consulted and Agree with Plan of Care  Patient       Patient will benefit from skilled therapeutic intervention in order to improve the following deficits and impairments:  Decreased endurance, Decreased activity tolerance, Decreased strength, Pain, Increased muscle spasms, Decreased range of motion, Hypermobility, Impaired flexibility, Postural dysfunction, Improper body mechanics, Decreased mobility  Visit Diagnosis: Chronic left shoulder pain  Muscle weakness (generalized)     Problem List Patient Active Problem List   Diagnosis Date Noted  . Abnormal echocardiogram 05/20/2018  . PVC's (premature ventricular contractions) 05/20/2018  . Tachycardia 05/20/2018  . Heart murmur 03/01/2017  . Costochondral separation 03/01/2017   Sedalia Muta, PT, DPT 10:25 AM  06/12/18    [facility] [address] [place], [place], [address] Phone: [phone]   Fax:  [phone]  Name: Jennifer Sloan MRN: [id number] Date of Birth: 1991-01-06

## 2018-06-14 ENCOUNTER — Ambulatory Visit (INDEPENDENT_AMBULATORY_CARE_PROVIDER_SITE_OTHER): Payer: 59 | Admitting: Physical Therapy

## 2018-06-14 ENCOUNTER — Encounter: Payer: Self-pay | Admitting: Physical Therapy

## 2018-06-14 DIAGNOSIS — M6281 Muscle weakness (generalized): Secondary | ICD-10-CM | POA: Diagnosis not present

## 2018-06-14 DIAGNOSIS — G8929 Other chronic pain: Secondary | ICD-10-CM

## 2018-06-14 DIAGNOSIS — M25512 Pain in left shoulder: Secondary | ICD-10-CM | POA: Diagnosis not present

## 2018-06-14 NOTE — Therapy (Signed)
[facility] [address] [place], [place], [address] Phone: [phone]   Fax:  [phone]  Physical Therapy Treatment  Patient Details  Name: Jennifer Sloan MRN: [id number] Date of Birth: Jan 03, 1991 Referring Provider (PT): Gaspar Bidding   Encounter Date: 06/14/2018  PT End of Session - 06/14/18 0955    Visit Number  5    Number of Visits  12    Date for PT Re-Evaluation  07/12/18    Authorization Type  UHC    PT Start Time  0934    PT Stop Time  1025    PT Time Calculation (min)  51 min    Activity Tolerance  Patient tolerated treatment well    Behavior During Therapy  [facility] for tasks assessed/performed       Past Medical History:  Diagnosis Date  . Anxiety   . Heart murmur     Past Surgical History:  Procedure Laterality Date  . WISDOM TOOTH EXTRACTION Bilateral     There were no vitals filed for this visit.  Subjective Assessment - 06/14/18 0953    Subjective  Pt states improving ability for exercises, less shaking, feels she is getting stronger, and states she is having less pain. She has tenderness in bil UTs today.     Currently in Pain?  Yes    Pain Score  4     Pain Location  Shoulder    Pain Orientation  Left    Pain Descriptors / Indicators  Tightness;Aching    Pain Type  Chronic pain    Pain Onset  More than a month ago    Pain Frequency  Intermittent         [facility] PT Assessment - 06/14/18 0935      Posture/Postural Control   Posture Comments  forward head, rounded shoulders, winged L scapula, more prominent ribs on L vs R posteriorly;                    [facility] Adult PT Treatment/Exercise - 06/14/18 0935      Exercises   Exercises  Neck;Shoulder      Shoulder Exercises: Prone   Horizontal ABduction 1  20 reps;Left    Other Prone Exercises  --      Shoulder Exercises: Sidelying   ABduction  20 reps;Left    Other Sidelying Exercises  Horizontal Abd 2x10;       Shoulder Exercises:  Standing   Horizontal ABduction  20 reps;Theraband    Theraband Level (Shoulder Horizontal ABduction)  Level 2 (Red)    Horizontal ABduction Limitations  Scap pull outs     External Rotation  20 reps;Theraband    Theraband Level (Shoulder External Rotation)  Level 2 (Red)    Internal Rotation  20 reps;Theraband    Theraband Level (Shoulder Internal Rotation)  Level 2 (Red)    Row  20 reps;Theraband    Theraband Level (Shoulder Row)  Level 2 (Red)    Other Standing Exercises  BIl UE slides (for scap stability) x20 at wall;      Other Standing Exercises  UE scaption and abd : 1lb 2x10 with noodle at wall for posture;        Shoulder Exercises: Body Blade   Flexion  30 seconds;2 reps    External Rotation  30 seconds;2 reps    Other Body Blade Exercises  at side/neutral 30 sec x2;       Modalities   Modalities  Moist  Heat      Moist Heat Therapy   Number Minutes Moist Heat  10 Minutes    Moist Heat Location  Cervical      Manual Therapy   Manual Therapy  Soft tissue mobilization;Joint mobilization;Passive ROM;Taping    Manual therapy comments  skilled palpation and monitoring of soft tissue during dry needling;     Joint Mobilization  cervical PA mobs gr 3;     Soft tissue mobilization  DTM to L UT,     Passive ROM  --       Trigger Point Dry Needling - 06/14/18 1452    Consent Given?  Yes    Muscles Treated Upper Body  Upper trapezius    Upper Trapezius Response  Twitch reponse elicited;Palpable increased muscle length   L and R             PT Short Term Goals - 05/31/18 1143      PT SHORT TERM GOAL #1   Title  Pt to be independent with initial HEP for posture    Time  2    Period  Weeks    Status  New    Target Date  06/14/18      PT SHORT TERM GOAL #2   Title  Pt to report decreased pain in L shoulder, to 5/10 with activity     Time  2    Period  Weeks    Status  New    Target Date  06/14/18        PT Long Term Goals - 05/31/18 1144      PT LONG  TERM GOAL #1   Title  Pt to report decreased pain of L shoulder and neck, to 0-2/10 with activity     Time  6    Period  Weeks    Status  New    Target Date  07/12/18      PT LONG TERM GOAL #2   Title  Pt to demo increased strenth of shoulder and scapular muscles to at least 4+/5 to improve stability and pain.     Time  6    Period  Weeks    Status  New    Target Date  07/12/18      PT LONG TERM GOAL #3   Title  Pt to independently correct posture, and state correct mechanics for playing violin and desk work.     Time  6    Period  Weeks    Status  New    Target Date  07/12/18            Plan - 06/14/18 1456    Clinical Impression Statement  Pt with increased soreness on L vs R with dry needling today, but does have decreased tightness and trigger points following session. Pt showing improvements in movement mechanics, and is requiring less cueing for posture with ther ex. Pt to benefif from progressive strengthening and stability.     Rehab Potential  Good    PT Frequency  2x / week    PT Duration  6 weeks    PT Treatment/Interventions  ADLs/Self Care Home Management;Cryotherapy;Electrical Stimulation;Iontophoresis 4mg /ml Dexamethasone;Moist Heat;Therapeutic activities;Functional mobility training;Ultrasound;Therapeutic exercise;Neuromuscular re-education;Patient/family education;Dry needling;Passive range of motion;Manual techniques;Taping;Spinal Manipulations;Joint Manipulations    PT Next Visit Plan  Progress strength/UE    Consulted and Agree with Plan of Care  Patient       Patient will benefit from skilled therapeutic intervention in order to  improve the following deficits and impairments:  Decreased endurance, Decreased activity tolerance, Decreased strength, Pain, Increased muscle spasms, Decreased range of motion, Hypermobility, Impaired flexibility, Postural dysfunction, Improper body mechanics, Decreased mobility  Visit Diagnosis: Chronic left shoulder  pain  Muscle weakness (generalized)     Problem List Patient Active Problem List   Diagnosis Date Noted  . Abnormal echocardiogram 05/20/2018  . PVC's (premature ventricular contractions) 05/20/2018  . Tachycardia 05/20/2018  . Heart murmur 03/01/2017  . Costochondral separation 03/01/2017    Sedalia Muta, PT, DPT 2:57 PM  06/14/18    [facility] [address] [place], [place], [address] Phone: [phone]   Fax:  [phone]  Name: Shardae Kleinman MRN: [id number] Date of Birth: August 20, 1990

## 2018-06-16 ENCOUNTER — Encounter: Payer: 59 | Admitting: Physical Therapy

## 2018-06-17 ENCOUNTER — Ambulatory Visit: Payer: 59 | Admitting: Sports Medicine

## 2018-06-21 ENCOUNTER — Ambulatory Visit (INDEPENDENT_AMBULATORY_CARE_PROVIDER_SITE_OTHER): Payer: 59 | Admitting: Physical Therapy

## 2018-06-21 ENCOUNTER — Encounter: Payer: Self-pay | Admitting: Physical Therapy

## 2018-06-21 DIAGNOSIS — G8929 Other chronic pain: Secondary | ICD-10-CM

## 2018-06-21 DIAGNOSIS — M6281 Muscle weakness (generalized): Secondary | ICD-10-CM | POA: Diagnosis not present

## 2018-06-21 DIAGNOSIS — M25512 Pain in left shoulder: Secondary | ICD-10-CM | POA: Diagnosis not present

## 2018-06-21 NOTE — Therapy (Signed)
[facility] [address] [address] [place], [place], [address] Phone: [phone]   Fax:  [phone]  Physical Therapy Treatment  Patient Details  Name: Jennifer Sloan MRN: [id number] Date of Birth: 1991-01-26 Referring Provider (PT): Gaspar BiddingMichael Rigby   Encounter Date: 06/21/2018  PT End of Session - 06/21/18 1559    Visit Number  6    Number of Visits  12    Date for PT Re-Evaluation  07/12/18    Authorization Type  UHC    PT Start Time  1015    PT Stop Time  1103    PT Time Calculation (min)  48 min    Activity Tolerance  Patient tolerated treatment well    Behavior During Therapy  [facility] for tasks assessed/performed       Past Medical History:  Diagnosis Date  . Anxiety   . Heart murmur     Past Surgical History:  Procedure Laterality Date  . WISDOM TOOTH EXTRACTION Bilateral     There were no vitals filed for this visit.  Subjective Assessment - 06/21/18 1558    Subjective  Pt states soreness in bil UT region. Also having increased pain in L side/thoracic region when doing some shoulder exercises.     Patient Stated Goals  Decrease pain in shoulder/neck.     Currently in Pain?  Yes    Pain Score  3     Pain Location  Shoulder    Pain Orientation  Left    Pain Descriptors / Indicators  Aching;Tightness    Pain Type  Chronic pain    Pain Onset  More than a month ago    Pain Frequency  Intermittent                       [facility] Adult PT Treatment/Exercise - 06/21/18 1021      Posture/Postural Control   Posture Comments  --      Exercises   Exercises  Neck;Shoulder      Shoulder Exercises: Supine   Protraction  20 reps    Protraction Limitations  SA punch 2lb     Horizontal ABduction  20 reps    Horizontal ABduction Limitations  YTB with TA    Other Supine Exercises  Flys 2lb x15 bil;     Other Supine Exercises  Education and practice for TA, supine march and SLR (pt with significant core and LE weakness and  instability)       Shoulder Exercises: Seated   Horizontal ABduction  --    Horizontal ABduction Weight (lbs)  --      Shoulder Exercises: Prone   Horizontal ABduction 1  20 reps;Both    Other Prone Exercises  Quadruped UE lifts x15; LE lifts x15;       Shoulder Exercises: Sidelying   ABduction  --    Other Sidelying Exercises  --      Shoulder Exercises: Standing   Horizontal ABduction  --    Theraband Level (Shoulder Horizontal ABduction)  --    Horizontal ABduction Limitations  --    External Rotation  20 reps;Theraband    Theraband Level (Shoulder External Rotation)  Level 2 (Red)    Internal Rotation  20 reps;Theraband    Theraband Level (Shoulder Internal Rotation)  Level 2 (Red)    Row  20 reps;Theraband    Theraband Level (Shoulder Row)  Level 3 (Green)    Other Standing Exercises  --  Other Standing Exercises  UE scaption and abd : 1lb 2x10 with noodle at wall for posture;        Shoulder Exercises: Body Blade   Flexion  --    External Rotation  --    Other Body Blade Exercises  --      Modalities   Modalities  Moist Heat      Moist Heat Therapy   Moist Heat Location  --      Manual Therapy   Manual Therapy  --    Manual therapy comments  --    Joint Mobilization  --    Soft tissue mobilization  --               PT Short Term Goals - 05/31/18 1143      PT SHORT TERM GOAL #1   Title  Pt to be independent with initial HEP for posture    Time  2    Period  Weeks    Status  New    Target Date  06/14/18      PT SHORT TERM GOAL #2   Title  Pt to report decreased pain in L shoulder, to 5/10 with activity     Time  2    Period  Weeks    Status  New    Target Date  06/14/18        PT Long Term Goals - 05/31/18 1144      PT LONG TERM GOAL #1   Title  Pt to report decreased pain of L shoulder and neck, to 0-2/10 with activity     Time  6    Period  Weeks    Status  New    Target Date  07/12/18      PT LONG TERM GOAL #2   Title  Pt to  demo increased strenth of shoulder and scapular muscles to at least 4+/5 to improve stability and pain.     Time  6    Period  Weeks    Status  New    Target Date  07/12/18      PT LONG TERM GOAL #3   Title  Pt to independently correct posture, and state correct mechanics for playing violin and desk work.     Time  6    Period  Weeks    Status  New    Target Date  07/12/18            Plan - 06/21/18 1600    Clinical Impression Statement  Pt with significant weakness in core and hips noted today. Pt having difficulty stabilizing core when performing UE activities, likely causing increased thoracic extension position and increased soreness. Pt educated on TA stabilization today, will benefit from HEP for LE and core strength as well. Pt with improving ability for UE ther ex, less muscle shaking seen, and pt able to perform increased reps prior to fatigue. Pt to benefit from continued care, and progressive strength and stabilization .    Rehab Potential  Good    PT Frequency  2x / week    PT Duration  6 weeks    PT Treatment/Interventions  ADLs/Self Care Home Management;Cryotherapy;Electrical Stimulation;Iontophoresis 4mg /ml Dexamethasone;Moist Heat;Therapeutic activities;Functional mobility training;Ultrasound;Therapeutic exercise;Neuromuscular re-education;Patient/family education;Dry needling;Passive range of motion;Manual techniques;Taping;Spinal Manipulations;Joint Manipulations    PT Next Visit Plan  Progress strength/UE    Consulted and Agree with Plan of Care  Patient       Patient will benefit from  skilled therapeutic intervention in order to improve the following deficits and impairments:  Decreased endurance, Decreased activity tolerance, Decreased strength, Pain, Increased muscle spasms, Decreased range of motion, Hypermobility, Impaired flexibility, Postural dysfunction, Improper body mechanics, Decreased mobility  Visit Diagnosis: Chronic left shoulder pain  Muscle  weakness (generalized)     Problem List Patient Active Problem List   Diagnosis Date Noted  . Abnormal echocardiogram 05/20/2018  . PVC's (premature ventricular contractions) 05/20/2018  . Tachycardia 05/20/2018  . Heart murmur 03/01/2017  . Costochondral separation 03/01/2017    Sedalia Muta, PT, DPT 4:03 PM  06/21/18    [facility] [address] [place], [place], [address] Phone: [phone]   Fax:  [phone]  Name: Jennifer Sloan MRN: [id number] Date of Birth: 1990-12-05

## 2018-06-23 ENCOUNTER — Ambulatory Visit (INDEPENDENT_AMBULATORY_CARE_PROVIDER_SITE_OTHER): Payer: 59 | Admitting: Physical Therapy

## 2018-06-23 ENCOUNTER — Encounter: Payer: 59 | Admitting: Physical Therapy

## 2018-06-23 DIAGNOSIS — M25512 Pain in left shoulder: Secondary | ICD-10-CM | POA: Diagnosis not present

## 2018-06-23 DIAGNOSIS — M6281 Muscle weakness (generalized): Secondary | ICD-10-CM

## 2018-06-23 DIAGNOSIS — G8929 Other chronic pain: Secondary | ICD-10-CM | POA: Diagnosis not present

## 2018-06-27 ENCOUNTER — Encounter: Payer: 59 | Admitting: Physical Therapy

## 2018-06-27 ENCOUNTER — Encounter: Payer: Self-pay | Admitting: Physical Therapy

## 2018-06-27 NOTE — Therapy (Signed)
[facility] [address] [place], [place], [address] Phone: [phone]   Fax:  [phone]  Physical Therapy Treatment  Patient Details  Name: Angeli Demilio MRN: [id number] Date of Birth: [birth year] Referring Provider (PT): Gaspar Bidding   Encounter Date: 06/23/2018  PT End of Session - 06/27/18 1122    Visit Number  7    Number of Visits  12    Date for PT Re-Evaluation  07/12/18    Authorization Type  UHC    PT Start Time  1015    PT Stop Time  1058    PT Time Calculation (min)  43 min    Activity Tolerance  Patient tolerated treatment well    Behavior During Therapy  [facility] for tasks assessed/performed       Past Medical History:  Diagnosis Date  . Anxiety   . Heart murmur     Past Surgical History:  Procedure Laterality Date  . WISDOM TOOTH EXTRACTION Bilateral     There were no vitals filed for this visit.  Subjective Assessment - 06/27/18 1118    Subjective  Pt states mild soreness in mid thoracic region when doing theraband exercises. Shoulder and Upper trap pain has been better.     Patient Stated Goals  Decrease pain in shoulder/neck.     Currently in Pain?  Yes    Pain Score  2     Pain Location  Shoulder    Pain Orientation  Left    Pain Descriptors / Indicators  Aching;Tightness    Pain Type  Chronic pain    Pain Onset  More than a month ago    Pain Frequency  Intermittent                                 PT Short Term Goals - 05/31/18 1143      PT SHORT TERM GOAL #1   Title  Pt to be independent with initial HEP for posture    Time  2    Period  Weeks    Status  New    Target Date  06/14/18      PT SHORT TERM GOAL #2   Title  Pt to report decreased pain in L shoulder, to 5/10 with activity     Time  2    Period  Weeks    Status  New    Target Date  06/14/18        PT Long Term Goals - 05/31/18 1144      PT LONG TERM GOAL #1   Title  Pt to report decreased pain of  L shoulder and neck, to 0-2/10 with activity     Time  6    Period  Weeks    Status  New    Target Date  07/12/18      PT LONG TERM GOAL #2   Title  Pt to demo increased strenth of shoulder and scapular muscles to at least 4+/5 to improve stability and pain.     Time  6    Period  Weeks    Status  New    Target Date  07/12/18      PT LONG TERM GOAL #3   Title  Pt to independently correct posture, and state correct mechanics for playing violin and desk work.     Time  6    Period  Weeks  Status  New    Target Date  07/12/18            Plan - 06/27/18 1123    Clinical Impression Statement  Pt with tightness in lat and subscap noted today, manual release done to improve today. Pt with max education and practice for decreasing thoracic extension with UE activities. Continued education on core stabilization as well. Pt likely getting pain in mid thoracic region due to compensation and weakness. Discussed not over doing theraband exercises, to decrease pain. Pt with follow up with MD next week as well. Pt progressing well with shoulder pain, plan to progress strength as tolerated.     Rehab Potential  Good    PT Frequency  2x / week    PT Duration  6 weeks    PT Treatment/Interventions  ADLs/Self Care Home Management;Cryotherapy;Electrical Stimulation;Iontophoresis 4mg /ml Dexamethasone;Moist Heat;Therapeutic activities;Functional mobility training;Ultrasound;Therapeutic exercise;Neuromuscular re-education;Patient/family education;Dry needling;Passive range of motion;Manual techniques;Taping;Spinal Manipulations;Joint Manipulations    PT Next Visit Plan  Progress strength/UE    Consulted and Agree with Plan of Care  Patient       Patient will benefit from skilled therapeutic intervention in order to improve the following deficits and impairments:  Decreased endurance, Decreased activity tolerance, Decreased strength, Pain, Increased muscle spasms, Decreased range of motion,  Hypermobility, Impaired flexibility, Postural dysfunction, Improper body mechanics, Decreased mobility  Visit Diagnosis: Chronic left shoulder pain  Muscle weakness (generalized)     Problem List Patient Active Problem List   Diagnosis Date Noted  . Abnormal echocardiogram 05/20/2018  . PVC's (premature ventricular contractions) 05/20/2018  . Tachycardia 05/20/2018  . Heart murmur 03/01/2017  . Costochondral separation 03/01/2017    Sedalia MutaLauren Jarome Trull, PT, DPT 11:26 AM  06/27/18    [facility] [address] [place], [place], [address] Phone: [phone]   Fax:  [phone]  Name: Lytle Michaelsmanda Bachand MRN: [id number] Date of Birth: 08-Apr-1991

## 2018-06-29 ENCOUNTER — Encounter: Payer: Self-pay | Admitting: Physical Therapy

## 2018-06-29 ENCOUNTER — Ambulatory Visit: Payer: 59 | Admitting: Sports Medicine

## 2018-06-29 ENCOUNTER — Ambulatory Visit (INDEPENDENT_AMBULATORY_CARE_PROVIDER_SITE_OTHER): Payer: 59 | Admitting: Physical Therapy

## 2018-06-29 DIAGNOSIS — M25512 Pain in left shoulder: Secondary | ICD-10-CM | POA: Diagnosis not present

## 2018-06-29 DIAGNOSIS — M6281 Muscle weakness (generalized): Secondary | ICD-10-CM | POA: Diagnosis not present

## 2018-06-29 DIAGNOSIS — G8929 Other chronic pain: Secondary | ICD-10-CM | POA: Diagnosis not present

## 2018-06-29 NOTE — Therapy (Signed)
[facility] [address] [place], [place], [address] Phone: [phone]   Fax:  [phone]  Physical Therapy Treatment  Patient Details  Name: Jennifer Sloan MRN: [id number] Date of Birth: 09-Aug-1990 Referring Provider (PT): Gaspar Bidding   Encounter Date: 06/29/2018  PT End of Session - 06/29/18 1036    Visit Number  8    Number of Visits  12    Date for PT Re-Evaluation  07/12/18    Authorization Type  UHC    PT Start Time  1017    PT Stop Time  1057    PT Time Calculation (min)  40 min    Activity Tolerance  Patient tolerated treatment well    Behavior During Therapy  [facility] for tasks assessed/performed       Past Medical History:  Diagnosis Date  . Anxiety   . Heart murmur     Past Surgical History:  Procedure Laterality Date  . WISDOM TOOTH EXTRACTION Bilateral     There were no vitals filed for this visit.  Subjective Assessment - 06/29/18 1035    Subjective  Pt state pain is improving slowly in shoulder and neck. She has intermittent pain, mostly at end of the day, but notes feeling much less tense overall.     Currently in Pain?  Yes    Pain Score  2     Pain Location  Shoulder    Pain Orientation  Left    Pain Descriptors / Indicators  Aching;Tightness    Pain Type  Chronic pain    Pain Onset  More than a month ago    Pain Frequency  Intermittent                       [facility] Adult PT Treatment/Exercise - 06/29/18 1016      Exercises   Exercises  Neck;Shoulder      Shoulder Exercises: Supine   Protraction  --  (Pended)     Protraction Limitations  --  (Pended)     Horizontal ABduction  --  (Pended)     Flexion  20 reps  (Pended)     Shoulder Flexion Weight (lbs)  1lb  (Pended)     Other Supine Exercises  Flys 2lb x15 bil;     Other Supine Exercises  Dead bug x20;   (Pended)       Shoulder Exercises: Prone   Horizontal ABduction 1  20 reps;Both  (Pended)     Other Prone Exercises   Quadruped UE lifts x15; LE lifts x15; UE/LE together x15   (Pended)       Shoulder Exercises: Standing   External Rotation  20 reps;Theraband    Theraband Level (Shoulder External Rotation)  Level 2 (Red)    Internal Rotation  20 reps;Theraband    Theraband Level (Shoulder Internal Rotation)  Level 2 (Red)    Row  20 reps;Theraband    Theraband Level (Shoulder Row)  Level 3 (Green)    Other Standing Exercises  Wall push ups x15;   (Pended)     Other Standing Exercises  UE scaption and abd : 1lb 2x10 with noodle at wall for posture;        Shoulder Exercises: Body Blade   Flexion  30 seconds;2 reps  (Pended)     ABduction  30 seconds;2 reps  (Pended)     External Rotation  30 seconds;2 reps  (Pended)  Modalities   Modalities  Moist Heat             PT Education - 06/29/18 1035    Education Details  HEP reviewed     Person(s) Educated  Patient    Methods  Explanation    Comprehension  Verbalized understanding       PT Short Term Goals - 05/31/18 1143      PT SHORT TERM GOAL #1   Title  Pt to be independent with initial HEP for posture    Time  2    Period  Weeks    Status  New    Target Date  06/14/18      PT SHORT TERM GOAL #2   Title  Pt to report decreased pain in L shoulder, to 5/10 with activity     Time  2    Period  Weeks    Status  New    Target Date  06/14/18        PT Long Term Goals - 05/31/18 1144      PT LONG TERM GOAL #1   Title  Pt to report decreased pain of L shoulder and neck, to 0-2/10 with activity     Time  6    Period  Weeks    Status  New    Target Date  07/12/18      PT LONG TERM GOAL #2   Title  Pt to demo increased strenth of shoulder and scapular muscles to at least 4+/5 to improve stability and pain.     Time  6    Period  Weeks    Status  New    Target Date  07/12/18      PT LONG TERM GOAL #3   Title  Pt to independently correct posture, and state correct mechanics for playing violin and desk work.     Time  6     Period  Weeks    Status  New    Target Date  07/12/18            Plan - 06/29/18 1225    Clinical Impression Statement  Pt progressing well with pain and tightness. She has improving ability and strength seen with ther ex. She does have weakness in UE, core and LE, but showing improvments each visit. Pt with improving ability for full shoulder AROM without pain or compensation with thoracic region. Pt to benefit from continued care, with progression to d/c with HEp in next 2 weeks.     Rehab Potential  Good    PT Frequency  2x / week    PT Duration  6 weeks    PT Treatment/Interventions  ADLs/Self Care Home Management;Cryotherapy;Electrical Stimulation;Iontophoresis 4mg /ml Dexamethasone;Moist Heat;Therapeutic activities;Functional mobility training;Ultrasound;Therapeutic exercise;Neuromuscular re-education;Patient/family education;Dry needling;Passive range of motion;Manual techniques;Taping;Spinal Manipulations;Joint Manipulations    PT Next Visit Plan  Progress strength/UE    Consulted and Agree with Plan of Care  Patient       Patient will benefit from skilled therapeutic intervention in order to improve the following deficits and impairments:  Decreased endurance, Decreased activity tolerance, Decreased strength, Pain, Increased muscle spasms, Decreased range of motion, Hypermobility, Impaired flexibility, Postural dysfunction, Improper body mechanics, Decreased mobility  Visit Diagnosis: Chronic left shoulder pain  Muscle weakness (generalized)     Problem List Patient Active Problem List   Diagnosis Date Noted  . Abnormal echocardiogram 05/20/2018  . PVC's (premature ventricular contractions) 05/20/2018  . Tachycardia 05/20/2018  . Heart  murmur 03/01/2017  . Costochondral separation 03/01/2017    Sedalia Muta, PT, DPT 12:27 PM  06/29/18    [place] [place] [address] [place], [place], [address] Phone: [phone]    Fax:  [phone]  Name: Allyn Bartelson MRN: [id number] Date of Birth: [birth year]

## 2018-07-05 ENCOUNTER — Encounter: Payer: 59 | Admitting: Physical Therapy

## 2018-07-07 ENCOUNTER — Ambulatory Visit (INDEPENDENT_AMBULATORY_CARE_PROVIDER_SITE_OTHER): Payer: 59 | Admitting: Physical Therapy

## 2018-07-07 DIAGNOSIS — G8929 Other chronic pain: Secondary | ICD-10-CM | POA: Diagnosis not present

## 2018-07-07 DIAGNOSIS — M6281 Muscle weakness (generalized): Secondary | ICD-10-CM | POA: Diagnosis not present

## 2018-07-07 DIAGNOSIS — M25512 Pain in left shoulder: Secondary | ICD-10-CM | POA: Diagnosis not present

## 2018-07-08 ENCOUNTER — Ambulatory Visit: Payer: 59 | Admitting: Sports Medicine

## 2018-07-08 ENCOUNTER — Encounter: Payer: Self-pay | Admitting: Sports Medicine

## 2018-07-08 ENCOUNTER — Ambulatory Visit (INDEPENDENT_AMBULATORY_CARE_PROVIDER_SITE_OTHER): Payer: 59

## 2018-07-08 VITALS — BP 130/70 | HR 125 | Ht 67.0 in | Wt 117.8 lb

## 2018-07-08 DIAGNOSIS — M25512 Pain in left shoulder: Secondary | ICD-10-CM

## 2018-07-08 DIAGNOSIS — G8929 Other chronic pain: Secondary | ICD-10-CM

## 2018-07-11 ENCOUNTER — Encounter: Payer: Self-pay | Admitting: Physical Therapy

## 2018-07-11 NOTE — Therapy (Addendum)
[facility] [address] [place], [place], [address] Phone: [phone]   Fax:  [phone]  Physical Therapy Treatment  Patient Details  Name: Jennifer Sloan MRN: [id number] Date of Birth: [birth year] Referring Provider (PT): Teresa Coombs   Encounter Date: 07/07/2018  PT End of Session - 07/11/18 0926    Visit Number  9    Number of Visits  12    Date for PT Re-Evaluation  07/12/18    Authorization Type  [employer]    PT Start Time  1216    PT Stop Time  1300    PT Time Calculation (min)  44 min    Activity Tolerance  Patient tolerated treatment well    Behavior During Therapy  [facility] for tasks assessed/performed       Past Medical History:  Diagnosis Date  . Anxiety   . Heart murmur     Past Surgical History:  Procedure Laterality Date  . WISDOM TOOTH EXTRACTION Bilateral     There were no vitals filed for this visit.  Subjective Assessment - 07/11/18 0925    Subjective  Pt states minimal pain in shoulder, but is concerned about the popping that she continues to have. She is seeing MD today.     Currently in Pain?  Yes    Pain Score  2     Pain Location  Shoulder    Pain Orientation  Left    Pain Descriptors / Indicators  Aching;Tightness    Pain Type  Acute pain    Pain Onset  More than a month ago    Pain Frequency  Intermittent                       [facility] Adult PT Treatment/Exercise - 07/11/18 0001      Exercises   Exercises  Neck;Shoulder      Shoulder Exercises: Supine   Horizontal ABduction  20 reps    Horizontal ABduction Weight (lbs)  2    Flexion  20 reps    Shoulder Flexion Weight (lbs)  1lb    Other Supine Exercises  Flys 2lb x15 bil;     Other Supine Exercises  Dead bug x20;       Shoulder Exercises: Prone   Other Prone Exercises  Quadruped UE/LE lifts x20;       Shoulder Exercises: Standing   External Rotation  20 reps;Theraband    Theraband Level (Shoulder External Rotation)  Level 3  (Green)    Internal Rotation  20 reps;Theraband    Theraband Level (Shoulder Internal Rotation)  Level 3 (Green)    Row  20 reps;Theraband    Theraband Level (Shoulder Row)  Level 3 (Green)    Other Standing Exercises  Wall push ups x15;     Other Standing Exercises  UE scaption and abd : x10 to 90 deg, x10 full ROM,        Shoulder Exercises: Body Blade   Flexion  30 seconds;2 reps    ABduction  30 seconds;2 reps    External Rotation  30 seconds;2 reps      Modalities   Modalities  Moist Heat               PT Short Term Goals - 05/31/18 1143      PT SHORT TERM GOAL #1   Title  Pt to be independent with initial HEP for posture    Time  2  Period  Weeks    Status  New    Target Date  06/14/18      PT SHORT TERM GOAL #2   Title  Pt to report decreased pain in L shoulder, to 5/10 with activity     Time  2    Period  Weeks    Status  New    Target Date  06/14/18        PT Long Term Goals - 05/31/18 1144      PT LONG TERM GOAL #1   Title  Pt to report decreased pain of L shoulder and neck, to 0-2/10 with activity     Time  6    Period  Weeks    Status  New    Target Date  07/12/18      PT LONG TERM GOAL #2   Title  Pt to demo increased strenth of shoulder and scapular muscles to at least 4+/5 to improve stability and pain.     Time  6    Period  Weeks    Status  New    Target Date  07/12/18      PT LONG TERM GOAL #3   Title  Pt to independently correct posture, and state correct mechanics for playing violin and desk work.     Time  6    Period  Weeks    Status  New    Target Date  07/12/18            Plan - 07/11/18 0931    Clinical Impression Statement  Pt able to progress strengthening and stabilization. She is showing improvments in strength. She continues to have noted instability and crepitus in GHJ. Pt with follow up with MD tomorrow. Plan to decide d/c plan at next visit.     Rehab Potential  Good    PT Frequency  2x / week    PT  Duration  6 weeks    PT Treatment/Interventions  ADLs/Self Care Home Management;Cryotherapy;Electrical Stimulation;Iontophoresis 72m/ml Dexamethasone;Moist Heat;Therapeutic activities;Functional mobility training;Ultrasound;Therapeutic exercise;Neuromuscular re-education;Patient/family education;Dry needling;Passive range of motion;Manual techniques;Taping;Spinal Manipulations;Joint Manipulations    PT Next Visit Plan  Progress strength/UE    Consulted and Agree with Plan of Care  Patient       Patient will benefit from skilled therapeutic intervention in order to improve the following deficits and impairments:  Decreased endurance, Decreased activity tolerance, Decreased strength, Pain, Increased muscle spasms, Decreased range of motion, Hypermobility, Impaired flexibility, Postural dysfunction, Improper body mechanics, Decreased mobility  Visit Diagnosis: Chronic left shoulder pain  Muscle weakness (generalized)     Problem List Patient Active Problem List   Diagnosis Date Noted  . Abnormal echocardiogram 05/20/2018  . PVC's (premature ventricular contractions) 05/20/2018  . Tachycardia 05/20/2018  . Heart murmur 03/01/2017  . Costochondral separation 03/01/2017   LLyndee Hensen PT, DPT 9:34 AM  07/11/18    [email or web address] [place] [address]Phone: [phone]  Fax:  [phone] Name: Jennifer BreachMRN: [id number] of Birth: 3April 17, 1992     PHYSICAL THERAPY DISCHARGE SUMMARY Number of visits since Eval:  9   Plan: Patient agrees to discharge.  Patient goals were partially met. Patient is being discharged due to not returning since the last visit.  ?????     LLyndee Hensen PT, DPT 11:32 AM  10/13/18

## 2018-07-12 ENCOUNTER — Encounter: Payer: 59 | Admitting: Physical Therapy

## 2018-07-14 ENCOUNTER — Encounter: Payer: 59 | Admitting: Physical Therapy

## 2018-07-19 ENCOUNTER — Encounter: Payer: 59 | Admitting: Physical Therapy

## 2018-07-21 ENCOUNTER — Encounter: Payer: 59 | Admitting: Physical Therapy

## 2018-08-02 ENCOUNTER — Telehealth: Payer: 59 | Admitting: Physician Assistant

## 2018-08-02 DIAGNOSIS — R112 Nausea with vomiting, unspecified: Secondary | ICD-10-CM

## 2018-08-02 MED ORDER — ONDANSETRON HCL 4 MG PO TABS: 4.0000 mg | ORAL_TABLET | Freq: Three times a day (TID) | ORAL | 0 refills | Status: AC | PRN

## 2018-08-02 NOTE — Progress Notes (Signed)
We are sorry that you are not feeling well.  Here is how we plan to help!  Based on what you have shared with me it looks like you have Acute Infectious Diarrhea.  Most cases of acute diarrhea are due to infections with virus and bacteria and are self-limited conditions lasting less than 14 days.  For your symptoms you may take Imodium 2 mg tablets that are over the counter at your local pharmacy. Take two tablet now and then one after each loose stool up to 6 a day.  Antibiotics are not needed for most people with diarrhea.  Optional: Zofran 4 mg 1 tablet every 8 hours as needed for nausea and vomiting  Optional: I have sent in Cipro 500 mg two tablets twice a day for five days or azithromycin 500 mg daily for 3 days.  HOME CARE We recommend changing your diet to help with your symptoms for the next few days. Drink plenty of fluids that contain water salt and sugar. Sports drinks such as Gatorade may help.  You may try broths, soups, bananas, applesauce, soft breads, mashed potatoes or crackers.  You are considered infectious for as long as the diarrhea continues. Hand washing or use of alcohol based hand sanitizers is recommend. It is best to stay out of work or school until your symptoms stop.   GET HELP RIGHT AWAY If you have dark yellow colored urine or do not pass urine frequently you should drink more fluids.   If your symptoms worsen  If you feel like you are going to pass out (faint) You have a new problem  MAKE SURE YOU  Understand these instructions. Will watch your condition. Will get help right away if you are not doing well or get worse.  Your e-visit answers were reviewed by a board certified advanced clinical practitioner to complete your personal care plan.  Depending on the condition, your plan could have included both over the counter or prescription medications.  If there is a problem please reply once you have received a response from your provider.  Your  safety is important to us.  If you have drug allergies check your prescription carefully.    You can use MyChart to ask questions about today's visit, request a non-urgent call back, or ask for a work or school excuse for 24 hours related to this e-Visit. If it has been greater than 24 hours you will need to follow up with your provider, or enter a new e-Visit to address those concerns.   You will get an e-mail in the next two days asking about your experience.  I hope that your e-visit has been valuable and will speed your recovery. Thank you for using e-visits.   ===View-only below this line===   ----- Message -----    From: Jennifer Sloan    Sent: 08/02/2018 10:24 AM EST      To: E-Visit Mailing List Subject: E-Visit Submission: Nausea & Vomiting  E-Visit Submission: Nausea & Vomiting --------------------------------  Question: What is your primary symptom? Answer:   Both vomiting and nausea  Question: Have you experienced fever or chills with your symptoms? Answer:   Yes  Question: Are you older than 60? Answer:   No  Question: Do you have a fever over 100.5? Answer:   Yes  Question: Do you have a fever over 103? Answer:   No  Question: How long have you been vomiting? Answer:   Started today  Question: How often have you  been vomiting? Answer:   1-5 times a day  Question: Is there a red or maroon color or the appearance of coffee grounds in the vomit? Answer:   No  Question: Do you have any of the following symptoms? (check all that apply) Answer:   Diarrhea  Question: Other symptoms or additional comments: Answer:     Question: Are you taking any of the following medications? Answer:     Question: Have you tried any over the counter medications?  Answer:   Yes  Question: If Yes, please indicate which of the following over the counter medications you have tried (Check all that apply) Answer:     Question: Other over the counter medications you have tried  or comments: Answer:   Loperamide  Question: Were any of the medications effective? Answer:   Yes  Question: Is your mouth dry? Answer:   Yes  Question: Have you urinated? Answer:   No  Question: Are you able to keep down liquids? Answer:   No  Question: Please list your medication allergies that you may have ? (If 'none' , please list as 'none') Answer:   Penicillin  Question: Are you pregnant? Answer:   I am confident that I am not pregnant  Question: Are you breastfeeding? Answer:   No  Question: Please list any additional comments  Answer:   I started throwing up around 1 AM. Threw up again around 4 and then just now. Diarrhea seems to have slowed down. Have been sipping on sprite and water. Tried drinking a ginger tea and that seemed to upset my stomach. Back to sprite and water now.

## 2018-08-15 ENCOUNTER — Telehealth: Payer: Self-pay | Admitting: Family Medicine

## 2018-08-15 NOTE — Telephone Encounter (Signed)
Can pt be worked in?  Copied from CRM [id number]. Topic: Appointment Scheduling - Scheduling Inquiry for Clinic >> Aug 15, 2018  8:45 AM Angela Nevin wrote: Reason for CRM: Patient called with complaints of trouble sleeping and is requesting to be worked in on Wednesday 01/15 with Dr. Earlene Plater. Unable to schedule same day slot at 11:20. Please advise. >> Aug 15, 2018  9:43 AM Pauline Aus B wrote: Please confirm a time and day and inbasket me message to schedule patient after calling

## 2018-08-16 NOTE — Telephone Encounter (Signed)
Called patient to get an appointment l/m to call back

## 2018-08-17 ENCOUNTER — Ambulatory Visit: Payer: 59 | Admitting: Family Medicine

## 2018-08-17 ENCOUNTER — Encounter: Payer: Self-pay | Admitting: Family Medicine

## 2018-08-17 VITALS — BP 110/60 | HR 129 | Temp 98.1°F | Ht 67.0 in | Wt 117.0 lb

## 2018-08-17 DIAGNOSIS — M25512 Pain in left shoulder: Secondary | ICD-10-CM

## 2018-08-17 DIAGNOSIS — G8929 Other chronic pain: Secondary | ICD-10-CM | POA: Insufficient documentation

## 2018-08-17 DIAGNOSIS — F5101 Primary insomnia: Secondary | ICD-10-CM

## 2018-08-17 MED ORDER — TRAZODONE HCL 50 MG PO TABS: 25.0000 mg | ORAL_TABLET | Freq: Every evening | ORAL | 3 refills | Status: DC | PRN

## 2018-08-17 NOTE — Progress Notes (Signed)
Jennifer Sloan is a 28 y.o. female is here for follow up.  History of Present Illness:   I,Jennifer Sloan,acting as a scribe for Helane RimaErica Layten Aiken, DO.,have documented all relevant documentation on the behalf of Helane Rimarica Dannetta Lekas, DO,as directed by  Helane RimaErica Sheral Pfahler, DO while in the presence of Helane RimaErica Sandor Arboleda, DO.  HPI: 28 yo patient for insomnia. Patient has a hx of insomnia but stated it has gotten worse over the past 3 weeks. Pt stated that it seem to come and go every other day. Pt also stated that she had a stomach bug that caused her to lose 2 days of sleep and has not gotten better since then. Pt has tried melatonin but denies relief.   Health Maintenance Due  Topic Date Due  . PAP-Cervical Cytology Screening  10/29/2010   [facility] 08/17/2018 03/18/2018 03/01/2017  Decreased Interest 0 0 0  Down, Depressed, Hopeless 2 0 0  PHQ - 2 Score 2 0 0  Altered sleeping 2 - -  Tired, decreased energy 2 - -  Change in appetite 0 - -  Feeling bad or failure about yourself  1 - -  Trouble concentrating 1 - -  Moving slowly or fidgety/restless 0 - -  Suicidal thoughts 0 - -  PHQ-9 Score 8 - -  Difficult doing work/chores Not difficult at all - -   PMHx, SurgHx, SocialHx, FamHx, Medications, and Allergies were reviewed in the Visit Navigator and updated as appropriate.   Patient Active Problem List   Diagnosis Date Noted  . Chronic left shoulder pain, followed by Sports Medicine 08/17/2018  . Abnormal echocardiogram 05/20/2018  . PVC's (premature ventricular contractions) 05/20/2018  . Heart murmur 03/01/2017  . Costochondral separation 03/01/2017   Social History   Tobacco Use  . Smoking status: Never Smoker  . Smokeless tobacco: Never Used  Substance Use Topics  . Alcohol use: Yes    Comment: 3-4 beverages per week  . Drug use: No   Current Medications and Allergies:   .  ASHWAGANDHA PO, Take 450 mg by mouth. , Disp: , Rfl:  .  cholecalciferol (VITAMIN D) 1000 units  tablet, Take 2,000 Units by mouth daily., Disp: , Rfl:  .  Ferrous Sulfate (IRON SUPPLEMENT PO), Take 28 mg by mouth once., Disp: , Rfl:  .  Magnesium 250 MG TABS, Take 250 mg by mouth., Disp: , Rfl:  .  Omega-3 Fatty Acids (FISH OIL) 500 MG CAPS, Take by mouth., Disp: , Rfl:  .  ondansetron (ZOFRAN) 4 MG tablet, Take 1 tablet (4 mg total) by mouth every 8 (eight) hours as needed for nausea or vomiting., Disp: 20 tablet, Rfl: 0   Allergies  Allergen Reactions  . Amoxicillin Rash   Review of Systems   Pertinent items are noted in the HPI. Otherwise, a complete ROS is negative.  Vitals:   Vitals:   08/17/18 0931  BP: 110/60  Pulse: (!) 129  Temp: 98.1 F (36.7 C)  TempSrc: Oral  SpO2: 99%  Weight: 117 lb (53.1 kg)  Height: 5\' 7"  (1.702 m)     Body mass index is 18.32 kg/m.  Physical Exam:   Physical Exam Vitals signs and nursing note reviewed.  HENT:     Head: Normocephalic and atraumatic.  Eyes:     Pupils: Pupils are equal, round, and reactive to light.  Neck:     Musculoskeletal: Normal range of motion and neck supple.  Cardiovascular:     Rate  and Rhythm: Normal rate and regular rhythm.     Heart sounds: Normal heart sounds.  Pulmonary:     Effort: Pulmonary effort is normal.  Abdominal:     Palpations: Abdomen is soft.  Skin:    General: Skin is warm.  Psychiatric:        Behavior: Behavior normal.    Assessment and Plan:   Jennifer Sloan was seen today for insomnia.  Diagnoses and all orders for this visit:  Primary insomnia Comments: Situational, with anxiety. Low dose Trazodone trial. She has a Engineer, manufacturing systems.  Orders: -     traZODone (DESYREL) 50 MG tablet; Take 0.5-1 tablets (25-50 mg total) by mouth at bedtime as needed for sleep.   . Orders and follow up as documented in EpicCare, reviewed diet, exercise and weight control, cardiovascular risk and specific lipid/LDL goals reviewed, reviewed medications and side effects in detail.  . Reviewed  expectations re: course of current medical issues. . Outlined signs and symptoms indicating need for more acute intervention. . Patient verbalized understanding and all questions were answered. . Patient received an After Visit Summary.  CMA served as Neurosurgeon during this visit. History, Physical, and Plan performed by medical provider. The above documentation has been reviewed and is accurate and complete. Helane Rima, D.O.  Helane Rima, DO [place], [place] [facility] 08/20/2018

## 2018-08-17 NOTE — Patient Instructions (Signed)
15 Tips to a Better Night's Sleep   Practice "good sleep hygiene". Here are some tips for you to try:   1. No reading or watching TV in bed. These are waking activities.  2. Go to bed when you're sleepy-tired, not when it's time to go to bed by habit.  3. Wind down during the second half of the evening before bedtime. Don't get involved in any kind of anxiety-provoking activities of thought 90 minutes before retiring to bed.  4. Do some breathing exercises or try to relax major muscle groups. Start at the toes and work up the body all the way to the forehead.  5. Your bed is for sleeping, so if you cannot sleep after 15-20 minutes in bed, get up and do something relaxing.  6. Have your room cool instead of warm.  7. Don't count sheep--counting is stimulating.  8. Exercise in the afternoon or early evening, but no later than three hours before bedtime.  9. Don't overeat or eat two to three hours before bedtime.  10. Try not to nap during the day.  11. If you awake in the middle of the night and can't get back to sleep within 30 minutes, get up and do something relaxing (no TV or reading anything stimulating).  12. Have no caffeine, alcohol or cigarettes two to three hours before retiring to bed.  13. If you have disturbing dreams or nightmares repeatedly, try to add an ending you enjoy or like better.  14. Keep a sleep journal. Thirty minutes before you go to bed, write down your concerns and hopes. It frees up your sleep from processing your dilemmas.  15. Listen to calming music or recorded sounds (ocean, forest, birds, crickets, brook) before bed.   If sleep problems persist, contact your physician or mental health professional. Let them know what is happening in your life. Your problem may have either organic or psychological contributors. Sleep disorders are considered chronic if they persist over more than one month.   

## 2018-08-18 ENCOUNTER — Telehealth: Payer: Self-pay

## 2018-08-18 NOTE — Telephone Encounter (Signed)
Spoke to pt to check on her regarding insomnia and new Rx Trazodone. Pt stated that she doesn't know if the Rx worked. She stated she took the Rx but still wasn't sleep in a hr and woke up doing the night. Pt was advised that this Rx needed to be taking at least 1-2 hours before bedtime. Pt verbalized understanding. Pt will f/u with Lelon Larrabee and will give med update.

## 2018-08-18 NOTE — Telephone Encounter (Signed)
Pt was seen yesterday.

## 2018-09-08 NOTE — Progress Notes (Signed)
Jennifer Sloan. Jennifer Sloan Sports Medicine [facility] [phone]  Jennifer Sloan - 28 y.o. female MRN [id number]  Date of birth: [birth year]  Visit Date: 07/08/2018  PCP: Helane Rima, DO   Referred by: Helane Rima, DO  SUBJECTIVE:   Chief Complaint  Patient presents with  . Follow-up neck and L shoulder    Sx are improving. She has been going to PT and this has been beneficial. She continues to have popping in her shoulders. Taking IBU prn. Has tried dry-needling with good relief. She has been doing HEP provided by PT.     HPI: Patient is here with the above symptoms.  Symptoms are mild at this time.  Seems to be worse with working out.    REVIEW OF SYSTEMS: Denies fevers, chills, recent weight gain or weight loss.  No night sweats. No significant nighttime awakenings due to this issue. Pt denies any change in bowel or bladder habits, muscle weakness, numbness or falls associated with this pain.  HISTORY:  Prior history reviewed and updated per electronic medical record.  Patient Active Problem List   Diagnosis Date Noted  . Abnormal echocardiogram 05/20/2018  . PVC's (premature ventricular contractions) 05/20/2018  . Heart murmur 03/01/2017    Diagnosed in 2016 by another facility.  EKG last done in 2016.   Marland Kitchen Costochondral separation 03/01/2017   Social History   Occupational History  . Occupation: Chemical engineer: [employer]  Tobacco Use  . Smoking status: Never Smoker  . Smokeless tobacco: Never Used  Substance and Sexual Activity  . Alcohol use: Yes    Comment: 3-4 beverages per week  . Drug use: No  . Sexual activity: Never   Social History   Social History Narrative   Never sexually active.    OBJECTIVE:  VS:  HT:5\' 7"  (170.2 cm)   WT:117 lb 12.8 oz (53.4 kg)  BMI:18.45    BP:130/70  HR:(!) 125bpm  TEMP: ( )  RESP:99 %   PHYSICAL EXAM: Adult female. No acute distress.  Alert and  appropriate. Left shoulder has full overhead range of motion although she has slight pain with terminal abduction and flexion. Small amount of crepitation with axial load and circumduction.  Negative sag sign.  Intrinsic rotator cuff strength is intact.  Scapular dyskinesis appreciated.  ASSESSMENT:   1. Chronic left shoulder pain     PROCEDURES:  None  PLAN:  Pertinent additional documentation may be included in corresponding procedure notes, imaging studies, problem based documentation and patient instructions.  No problem-specific Assessment & Plan notes found for this encounter.  She is having small improvements.  X-rays are reassuring that there is no significant osteoarthritis.  Continue therapy and three-month follow-up recommended at this time to ensure clinical resolution.  Continue previously prescribed home exercise program.   Activity modifications and the importance of avoiding exacerbating activities (limiting pain to no more than a 4 / 10 during or following activity) recommended and discussed.  Discussed red flag symptoms that warrant earlier emergent evaluation and patient voices understanding.   No orders of the defined types were placed in this encounter.  Lab Orders  No laboratory test(s) ordered today   Imaging Orders     DG Shoulder Left Referral Orders  No referral(s) requested today    Return in about 3 months (around 10/07/2018).          Andrena Mews, DO  [facility] Physician

## 2018-09-16 ENCOUNTER — Ambulatory Visit: Payer: 59 | Admitting: Physician Assistant

## 2018-09-16 ENCOUNTER — Encounter: Payer: Self-pay | Admitting: Physician Assistant

## 2018-09-16 VITALS — BP 120/78 | HR 109 | Temp 98.0°F | Ht 67.0 in | Wt 118.0 lb

## 2018-09-16 DIAGNOSIS — R636 Underweight: Secondary | ICD-10-CM | POA: Diagnosis not present

## 2018-09-16 DIAGNOSIS — Z713 Dietary counseling and surveillance: Secondary | ICD-10-CM

## 2018-09-16 DIAGNOSIS — F5101 Primary insomnia: Secondary | ICD-10-CM

## 2018-09-16 MED ORDER — TRAZODONE HCL 50 MG PO TABS: 25.0000 mg | ORAL_TABLET | Freq: Every evening | ORAL | 3 refills | Status: AC | PRN

## 2018-09-16 NOTE — Patient Instructions (Signed)
It was great to meet you!  Feel free to meet again with me anytime, or send me a MyChart message if anything comes up in the meantime.

## 2018-09-16 NOTE — Progress Notes (Signed)
Jennifer Sloan is a 28 y.o. female here for Nutrition Consult.  I acted as a Neurosurgeonscribe for Energy East CorporationSamantha Elbia Paro, PA-C Corky Mullonna Orphanos, LPN  History of Present Illness:   Chief Complaint  Patient presents with  . Nutrition Counseling    HPI   Insomnia Pt having trouble sleeping was started on Trazodone 50 mg at bedtime a month ago and does feel like it is working. Pt is getting 7 hours of sleep, good sleep and feels a lot better during the day.  Nutrition Patient is here to discuss Diet and Nutrition. Pt is having trouble gaining weight. Pt thinks has food allergies and maybe gluten allergy. Pt loses weight when stressed, does not skip meals but eats less. Since pt saw Dr. Earlene PlaterWallace last has been trying to eat snacks in between meals. Has gained about 1 lb since that time.  Dietary recall: Wakes up at 7am Breakfast -- 8:30am -- dry cereal (cheerios/life) occasional fairlife lactose free whole milk; OR oatmeal with fruit and nuts; coffee Lunch -- 10:30am -- granola bar (adult Clif bars) or smoothie (frozen fruit, Olli protein powder, coconut water) Lunch -- 1pm -- wrap with a smoothie; #1 at [employer], meal plan (leftover pasta with chicken) Dinner -- well balanced meals with veggies, carbohydrates, and lean meats Dessert -- ice cream, bowl of cereal -- not always Snacks -- dried fruit, cheese, smoothie Beverages  -- mostly water  Dairy and chocolate -- causes acne, bloating, gas  Paternal aunt with gluten sensitivity (?celiac disease), SIBO  Weight: Wt Readings from Last 3 Encounters:  09/16/18 118 lb (53.5 kg)  08/17/18 117 lb (53.1 kg)  07/08/18 117 lb 12.8 oz (53.4 kg)   Exercise: PT exercises every other day  Sleep: Sleeping well now that she is on trazodone  Goals: 1- maintaining a healthy weight 2- clearing up my skin 3- building muscle  Estimated daily energy needs: Calories: >2200 kcal Protein: at least 70 g Fluid: 2000 ml  Past Medical History:  Diagnosis Date  . Anxiety    . Heart murmur      Social History   Socioeconomic History  . Marital status: Single    Spouse name: Not on file  . Number of children: Not on file  . Years of education: Not on file  . Highest education level: Not on file  Occupational History  . Occupation: Chemical engineerDirector    Employer: [employer]  Social Needs  . Financial resource strain: Not on file  . Food insecurity:    Worry: Not on file    Inability: Not on file  . Transportation needs:    Medical: Not on file    Non-medical: Not on file  Tobacco Use  . Smoking status: Never Smoker  . Smokeless tobacco: Never Used  Substance and Sexual Activity  . Alcohol use: Yes    Comment: 3-4 beverages per week  . Drug use: No  . Sexual activity: Never  Lifestyle  . Physical activity:    Days per week: Not on file    Minutes per session: Not on file  . Stress: Not on file  Relationships  . Social connections:    Talks on phone: Not on file    Gets together: Not on file    Attends religious service: Not on file    Active member of club or organization: Not on file    Attends meetings of clubs or organizations: Not on file    Relationship status: Not on  file  . Intimate partner violence:    Fear of current or ex partner: Not on file    Emotionally abused: Not on file    Physically abused: Not on file    Forced sexual activity: Not on file  Other Topics Concern  . Not on file  Social History Narrative   Never sexually active.    Past Surgical History:  Procedure Laterality Date  . WISDOM TOOTH EXTRACTION Bilateral     Family History  Problem Relation Age of Onset  . Mitral valve prolapse Mother   . Breast cancer Maternal Grandmother 30  . Lung cancer Maternal Grandfather   . Colon cancer Paternal Grandmother   . Colon cancer Paternal Grandfather     Allergies  Allergen Reactions  . Amoxicillin Rash    Current Medications:   Current Outpatient Medications:  .  cholecalciferol  (VITAMIN D) 1000 units tablet, Take 2,000 Units by mouth daily., Disp: , Rfl:  .  Ferrous Sulfate (IRON SUPPLEMENT PO), Take 28 mg by mouth once., Disp: , Rfl:  .  ondansetron (ZOFRAN) 4 MG tablet, Take 1 tablet (4 mg total) by mouth every 8 (eight) hours as needed for nausea or vomiting., Disp: 20 tablet, Rfl: 0 .  traZODone (DESYREL) 50 MG tablet, Take 0.5-1 tablets (25-50 mg total) by mouth at bedtime as needed for sleep., Disp: 30 tablet, Rfl: 3 .  Omega-3 Fatty Acids (FISH OIL) 500 MG CAPS, Take by mouth., Disp: , Rfl:  .  Specialty Vitamins Products (MM BIOTIN/KERATIN) CAPS, Take by mouth daily., Disp: , Rfl:    Review of Systems:   Review of Systems  Constitutional: Negative for chills, fever, malaise/fatigue and weight loss.  Respiratory: Negative for shortness of breath.   Cardiovascular: Negative for chest pain, orthopnea, claudication and leg swelling.  Gastrointestinal: Negative for heartburn, nausea and vomiting.  Neurological: Negative for dizziness, tingling and headaches.  Psychiatric/Behavioral: Negative for depression. The patient is nervous/anxious and has insomnia.       Vitals:   Vitals:   09/16/18 1021  BP: 120/78  Pulse: (!) 109  Temp: 98 F (36.7 C)  TempSrc: Oral  SpO2: 97%  Weight: 118 lb (53.5 kg)  Height: 5\' 7"  (1.702 m)     Body mass index is 18.48 kg/m.  Physical Exam:   Physical Exam Constitutional:      Appearance: She is well-developed.  HENT:     Head: Normocephalic and atraumatic.  Eyes:     Conjunctiva/sclera: Conjunctivae normal.  Neck:     Musculoskeletal: Normal range of motion and neck supple.  Pulmonary:     Effort: Pulmonary effort is normal.  Musculoskeletal: Normal range of motion.  Skin:    General: Skin is warm and dry.  Neurological:     Mental Status: She is alert and oriented to person, place, and time.  Psychiatric:        Behavior: Behavior normal.        Thought Content: Thought content normal.         Judgment: Judgment normal.     Assessment and Plan:    Beki was seen today for nutrition counseling.  Diagnoses and all orders for this visit:  Encounter for nutritional counseling; Underweight Discussed specific, individualized recommendations regarding nutrition including balancing out meals and snacks, specifically adding in more protein, and eating regularly throughout the day. Handouts provided included: Balanced Plate, Balanced Snack List, Balanced Breakfast, 2400 Calorie Sample Menus. Provided emotional support and encouraged slow, steady weight  gain. Patient's questions answered throughout encounter. Follow-up with me prn.  Primary insomnia Comments: Situational, with anxiety. Low dose Trazodone trial. She has a Engineer, manufacturing systems.  Orders: -     traZODone (DESYREL) 50 MG tablet; Take 0.5-1 tablets (25-50 mg total) by mouth at bedtime as needed for sleep.   . Reviewed expectations re: course of current medical issues. . Discussed self-management of symptoms. . Outlined signs and symptoms indicating need for more acute intervention. . Patient verbalized understanding and all questions were answered. . See orders for this visit as documented in the electronic medical record. . Patient received an After-Visit Summary.  CMA or LPN served as scribe during this visit. History, Physical, and Plan performed by medical provider. Documentation and orders reviewed and attested to.  I spent 25 minutes with this patient, greater than 50% was face-to-face time counseling regarding the above diagnoses.   Jarold Motto, PA-C

## 2018-10-07 ENCOUNTER — Ambulatory Visit: Payer: 59 | Admitting: Sports Medicine

## 2019-05-04 IMAGING — DX DG SHOULDER 2+V*L*
3 series · 3 of 3 positions shown · non-contrast
Comparison: None.

CLINICAL DATA: Left shoulder pain

EXAM:
LEFT SHOULDER - 2+ VIEW

[shoulder grashey ap]
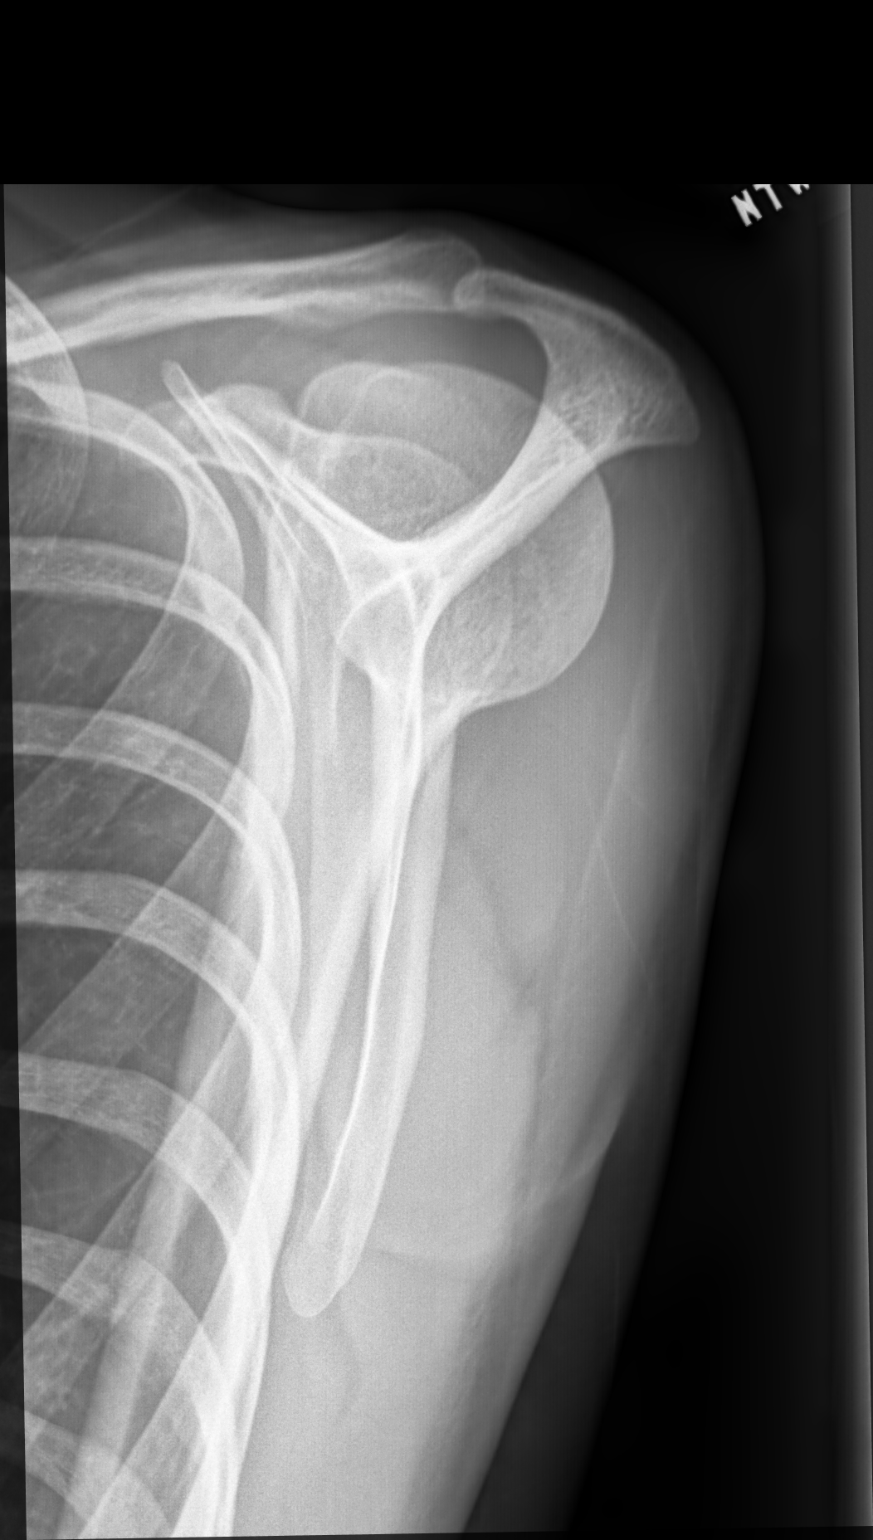

[shoulder y view]
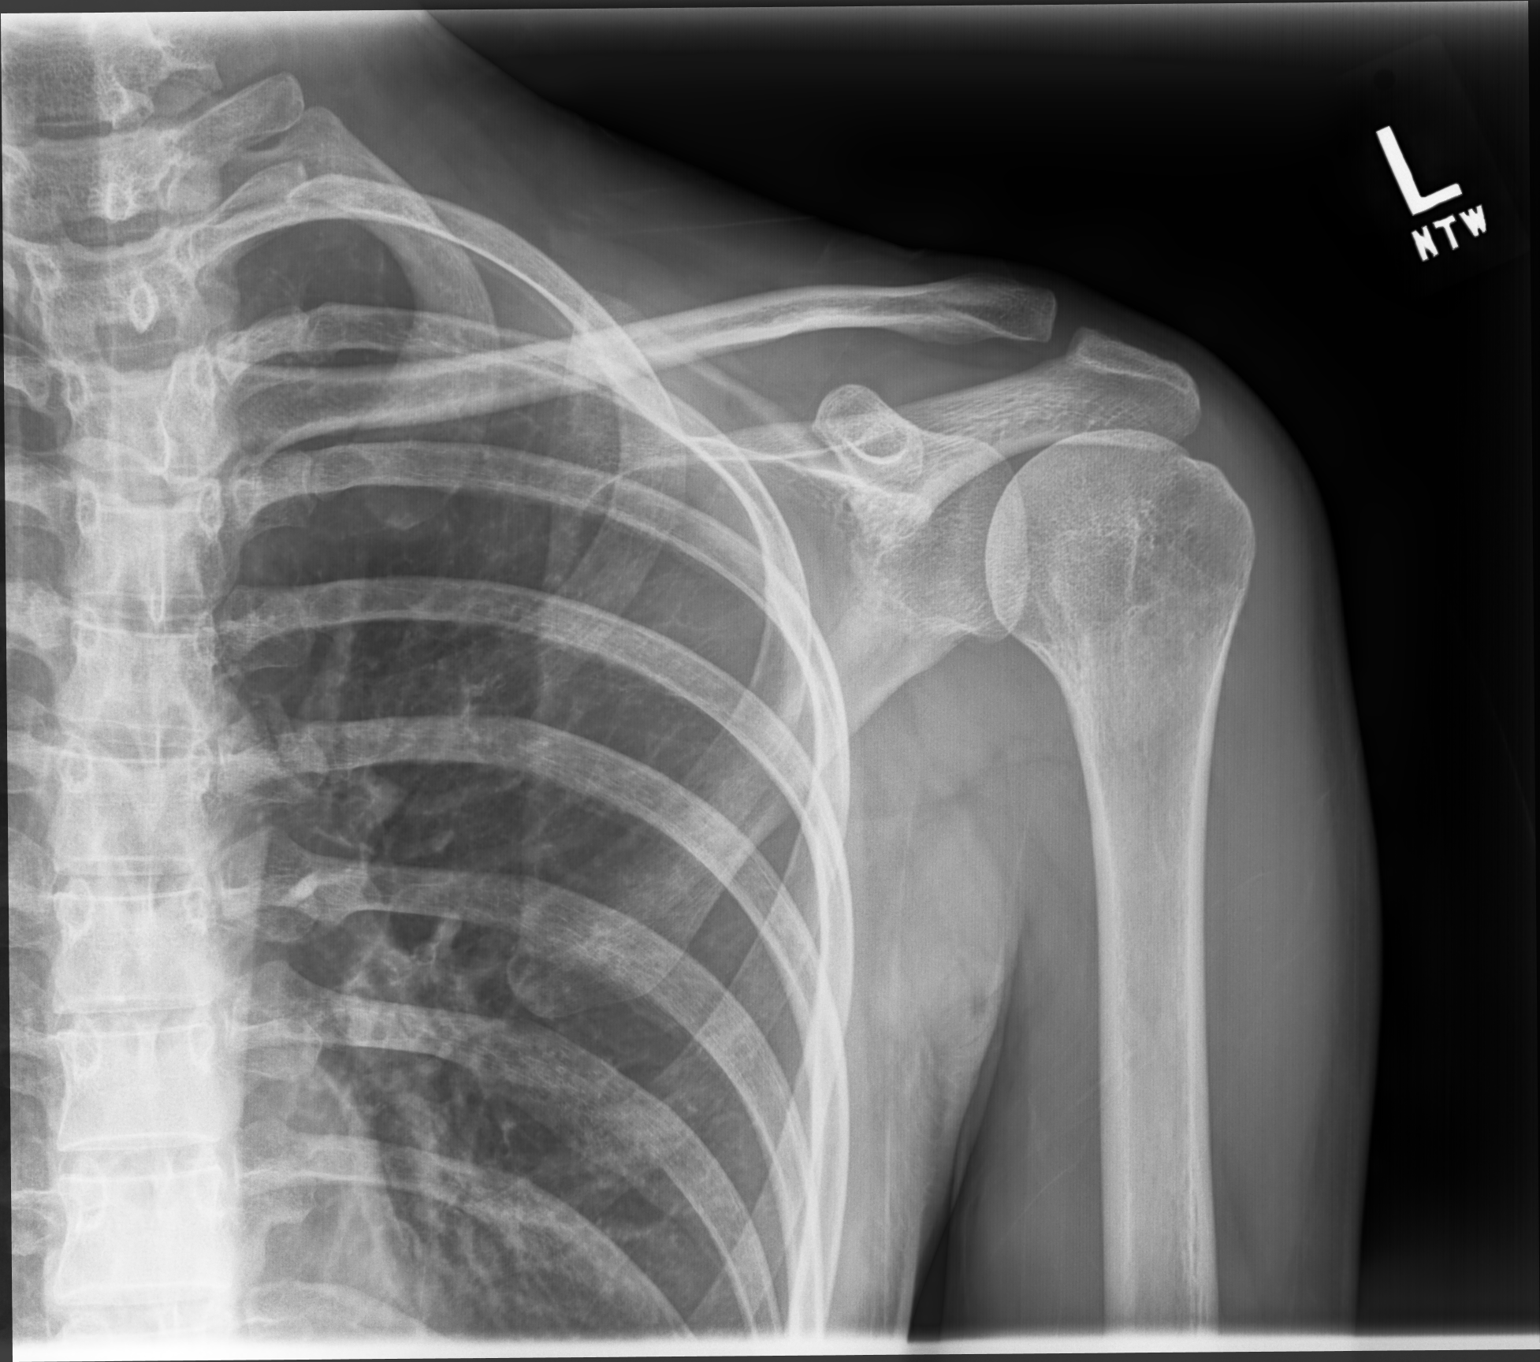

[shoulder axial]
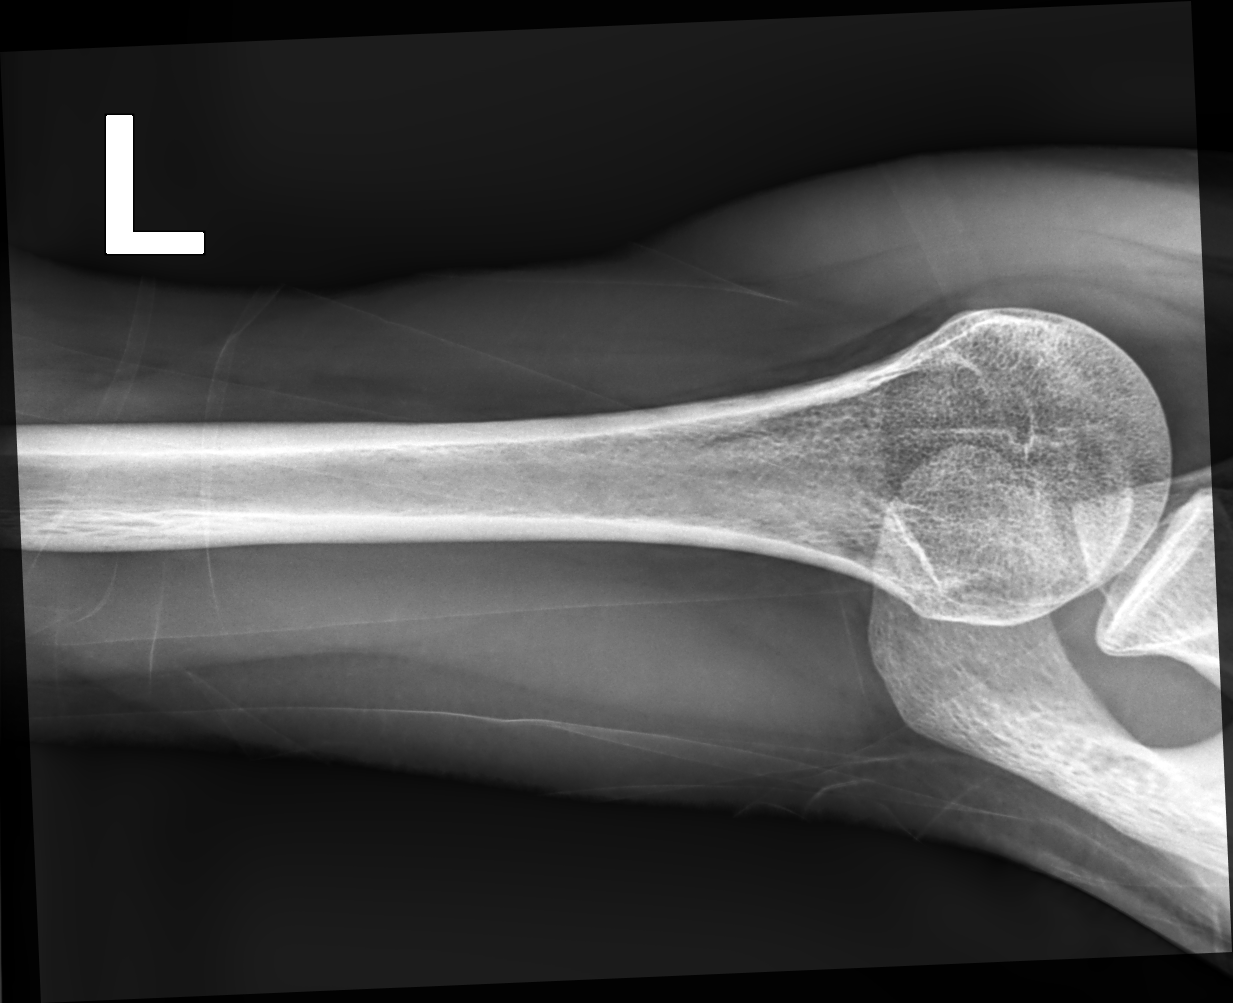

[3 of 3 positions shown; findings below may reference images not displayed]

FINDINGS: There is no evidence of fracture or dislocation. There is no
evidence of arthropathy or other focal bone abnormality. Soft
tissues are unremarkable.
IMPRESSION: Negative.

## 2019-10-16 ENCOUNTER — Telehealth: Payer: 59 | Admitting: Physician Assistant

## 2019-10-16 DIAGNOSIS — N898 Other specified noninflammatory disorders of vagina: Secondary | ICD-10-CM

## 2019-10-16 MED ORDER — MONISTAT 1 COMBO PACK 1200 & 2 MG & % VA KIT: 1.0000 | PACK | Freq: Once | VAGINAL | 0 refills | Status: AC

## 2019-10-16 NOTE — Progress Notes (Signed)
We are sorry that you are not feeling well. Here is how we plan to help! Based on what you shared with me it looks like you: May have a yeast vaginosis  Vaginosis is an inflammation of the vagina that can result in discharge, itching and pain. The cause is usually a change in the normal balance of vaginal bacteria or an infection. Vaginosis can also result from reduced estrogen levels after menopause.  Your treatment plan is Monistat (miconazole); I have prescribed the formulation that includes a one-time dose vaginally as well as an additional cream that can be applied topically around the effected area to reduce itching. I would also recommend wearing breathable cotton underwear, using sensitive-skin formulation soaps/body wash, and avoiding any scented sanitary products such as pads or tampons.   Be sure to take all of the medication as directed. Stop taking any medication if you develop a rash, tongue swelling or shortness of breath. Mothers who are breast feeding should consider pumping and discarding their breast milk while on these antibiotics. However, there is no consensus that infant exposure at these doses would be harmful.  Remember that medication creams can weaken latex condoms.    The most common causes of vaginosis are:   Bacterial vaginosis which results from an overgrowth of one on several organisms that are normally present in your vagina.   Yeast infections which are caused by a naturally occurring fungus called candida.   Vaginal atrophy (atrophic vaginosis) which results from the thinning of the vagina from reduced estrogen levels after menopause.   Trichomoniasis which is caused by a parasite and is commonly transmitted by sexual intercourse.  Factors that increase your risk of developing vaginosis include: Marland Kitchen Medications, such as antibiotics and steroids . Uncontrolled diabetes . Use of hygiene products such as bubble bath, vaginal spray or vaginal  deodorant . Douching . Wearing damp or tight-fitting clothing . Using an intrauterine device (IUD) for birth control . Hormonal changes, such as those associated with pregnancy, birth control pills or menopause . Sexual activity . Having a sexually transmitted infection.   HOME CARE:  Good hygiene may prevent some types of vaginosis from recurring and may relieve some symptoms:  . Avoid baths, hot tubs and whirlpool spas. Rinse soap from your outer genital area after a shower, and dry the area well to prevent irritation. Don't use scented or harsh soaps, such as those with deodorant or antibacterial action. Marland Kitchen Avoid irritants. These include scented tampons and pads. . Wipe from front to back after using the toilet. Doing so avoids spreading fecal bacteria to your vagina.  Other things that may help prevent vaginosis include:  Marland Kitchen Don't douche. Your vagina doesn't require cleansing other than normal bathing. Repetitive douching disrupts the normal organisms that reside in the vagina and can actually increase your risk of vaginal infection. Douching won't clear up a vaginal infection. . Use a latex condom. Both female and female latex condoms may help you avoid infections spread by sexual contact. . Wear cotton underwear. Also wear pantyhose with a cotton crotch. If you feel comfortable without it, skip wearing underwear to bed. Yeast thrives in [employer] Your symptoms should improve in the next day or two.  GET HELP RIGHT AWAY IF:  . You have pain in your lower abdomen ( pelvic area or over your ovaries) . You develop nausea or vomiting . You develop a fever . Your discharge changes or worsens . You have persistent pain with intercourse . You  develop shortness of breath, a rapid pulse, or you faint.  These symptoms could be signs of problems or infections that need to be evaluated by a medical provider now.  MAKE SURE YOU    Understand these instructions.  Will watch your  condition.  Will get help right away if you are not doing well or get worse.  Your e-visit answers were reviewed by a board certified advanced clinical practitioner to complete your personal care plan. Depending upon the condition, your plan could have included both over the counter or prescription medications. Please review your pharmacy choice to make sure that you have choses a pharmacy that is open for you to pick up any needed prescription, Your safety is important to [place]. If you have drug allergies check your prescription carefully.   You can use MyChart to ask questions about today's visit, request a non-urgent call back, or ask for a work or school excuse for 24 hours related to this e-Visit. If it has been greater than 24 hours you will need to follow up with your provider, or enter a new e-Visit to address those concerns. You will get a MyChart message within the next two days asking about your experience. I hope that your e-visit has been valuable and will speed your recovery.  Greater than 5 minutes, yet less than 10 minutes of time have been spent researching, coordinating, and implementing care for this patient today.
